# Patient Record
Sex: Female | Born: 1980 | Race: White | Hispanic: No | Marital: Single | Smoking: Current every day smoker
Health system: Southern US, Community
[De-identification: ages and names within clinical notes are randomized; demographics above are authoritative.]

## PROBLEM LIST (undated history)

## (undated) DIAGNOSIS — O26899 Other specified pregnancy related conditions, unspecified trimester: Secondary | ICD-10-CM

## (undated) DIAGNOSIS — Z789 Other specified health status: Secondary | ICD-10-CM

## (undated) DIAGNOSIS — F32A Depression, unspecified: Secondary | ICD-10-CM

## (undated) DIAGNOSIS — R12 Heartburn: Secondary | ICD-10-CM

## (undated) DIAGNOSIS — F329 Major depressive disorder, single episode, unspecified: Secondary | ICD-10-CM

## (undated) HISTORY — PX: TUBAL LIGATION: SHX77

---
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
## 2002-09-03 ENCOUNTER — Encounter: Payer: Self-pay | Admitting: *Deleted

## 2002-09-03 ENCOUNTER — Ambulatory Visit (HOSPITAL_COMMUNITY): Admission: RE | Admit: 2002-09-03 | Discharge: 2002-09-03 | Payer: Self-pay | Admitting: *Deleted

## 2002-09-23 ENCOUNTER — Encounter: Payer: Self-pay | Admitting: *Deleted

## 2002-09-23 ENCOUNTER — Ambulatory Visit (HOSPITAL_COMMUNITY): Admission: RE | Admit: 2002-09-23 | Discharge: 2002-09-23 | Payer: Self-pay | Admitting: *Deleted

## 2003-03-21 ENCOUNTER — Emergency Department (HOSPITAL_COMMUNITY): Admission: EM | Admit: 2003-03-21 | Discharge: 2003-03-21 | Payer: Self-pay | Admitting: Emergency Medicine

## 2004-04-03 ENCOUNTER — Ambulatory Visit: Payer: Self-pay | Admitting: Family Medicine

## 2004-04-03 ENCOUNTER — Other Ambulatory Visit: Admission: RE | Admit: 2004-04-03 | Discharge: 2004-04-03 | Payer: Self-pay | Admitting: Family Medicine

## 2004-06-11 ENCOUNTER — Ambulatory Visit: Payer: Self-pay | Admitting: Family Medicine

## 2004-08-13 ENCOUNTER — Ambulatory Visit: Payer: Self-pay | Admitting: Family Medicine

## 2004-11-02 ENCOUNTER — Ambulatory Visit: Payer: Self-pay | Admitting: Family Medicine

## 2005-01-24 ENCOUNTER — Ambulatory Visit: Payer: Self-pay | Admitting: Family Medicine

## 2005-01-25 ENCOUNTER — Encounter: Admission: RE | Admit: 2005-01-25 | Discharge: 2005-01-25 | Payer: Self-pay | Admitting: Family Medicine

## 2005-01-29 ENCOUNTER — Other Ambulatory Visit: Admission: RE | Admit: 2005-01-29 | Discharge: 2005-01-29 | Payer: Self-pay | Admitting: Family Medicine

## 2005-01-29 ENCOUNTER — Ambulatory Visit: Payer: Self-pay | Admitting: Family Medicine

## 2005-01-29 ENCOUNTER — Encounter (INDEPENDENT_AMBULATORY_CARE_PROVIDER_SITE_OTHER): Payer: Self-pay | Admitting: *Deleted

## 2005-10-15 ENCOUNTER — Ambulatory Visit: Payer: Self-pay | Admitting: Family Medicine

## 2005-10-22 ENCOUNTER — Encounter: Admission: RE | Admit: 2005-10-22 | Discharge: 2005-10-22 | Payer: Self-pay | Admitting: Family Medicine

## 2006-02-14 ENCOUNTER — Ambulatory Visit: Payer: Self-pay | Admitting: Family Medicine

## 2006-02-14 ENCOUNTER — Other Ambulatory Visit: Admission: RE | Admit: 2006-02-14 | Discharge: 2006-02-14 | Payer: Self-pay | Admitting: Family Medicine

## 2006-02-14 ENCOUNTER — Encounter: Payer: Self-pay | Admitting: Family Medicine

## 2006-05-29 ENCOUNTER — Ambulatory Visit: Payer: Self-pay | Admitting: Internal Medicine

## 2007-04-02 ENCOUNTER — Other Ambulatory Visit: Admission: RE | Admit: 2007-04-02 | Discharge: 2007-04-02 | Payer: Self-pay | Admitting: Family Medicine

## 2007-04-03 ENCOUNTER — Encounter: Admission: RE | Admit: 2007-04-03 | Discharge: 2007-04-03 | Payer: Self-pay | Admitting: Family Medicine

## 2007-04-06 ENCOUNTER — Encounter: Admission: RE | Admit: 2007-04-06 | Discharge: 2007-04-06 | Payer: Self-pay | Admitting: Family Medicine

## 2008-10-20 ENCOUNTER — Ambulatory Visit (HOSPITAL_COMMUNITY): Admission: RE | Admit: 2008-10-20 | Discharge: 2008-10-20 | Payer: Self-pay | Admitting: Family Medicine

## 2009-01-05 ENCOUNTER — Emergency Department (HOSPITAL_COMMUNITY): Admission: EM | Admit: 2009-01-05 | Discharge: 2009-01-05 | Payer: Self-pay | Admitting: Family Medicine

## 2010-02-25 NOTE — L&D Delivery Note (Signed)
Preoperative diagnosis: Intrauterine pregnancy at term with prior cesarean section for repeat. Multi-. Desirous of sterility.  Postoperative diagnosis: Same  Procedure: Repeat low transverse cesarean section. Bilateral tubal ligation.  Surgeon: Richardean Chimera  Anesthesia: Spinal  Estimated blood loss: 600 cc  Intraoperative blood replacement: None  Complications: None  Findings: Viable female infant Apgars 9 and 9

## 2010-03-28 LAB — HIV ANTIBODY (ROUTINE TESTING W REFLEX): HIV: NONREACTIVE

## 2010-03-28 LAB — ABO/RH: RH Type: POSITIVE

## 2010-03-28 LAB — HEPATITIS B SURFACE ANTIGEN: Hepatitis B Surface Ag: POSITIVE

## 2010-03-28 LAB — ANTIBODY SCREEN: Antibody Screen: NEGATIVE

## 2010-03-28 LAB — RUBELLA ANTIBODY, IGM: Rubella: IMMUNE

## 2010-07-13 NOTE — Op Note (Signed)
[facility]  Patient:    Laura English, Laura English                     MRN: [id number] Proc. Date: 05/03/00 Adm. Date:  [id number] Attending:  Douglass Rivers                           Operative Report  PREOPERATIVE DIAGNOSES:       1. Arrest of dilation and descent.                               2. O.T. arrest.                               3. Chorioamnionitis.  POSTOPERATIVE DIAGNOSES:      1. Arrest of dilation and descent.                               2. O.T. arrest.                               3. Chorioamnionitis.  PROCEDURE:                    Primary cesarean section, left lateral transverse.  SURGEON:                      Douglass Rivers, M.D.  ASSISTANT:                    Scrub technician.  ANESTHESIA:                   Epidural.  ESTIMATED BLOOD LOSS:         300 cc.  FINDINGS:                     Viable female infant in the OT presentation. Clear amniotic fluid.  Apgars 9 and 9.  Birth weight 7 lb 15 oz.  Normal uterus, tubes and ovaries.  COMPLICATIONS:                None.  PATHOLOGY:                    Placenta.  DESCRIPTION OF PROCEDURE:     The patient was taken to the operating room and placed in the supine position with left lateral displacement, prepped and draped in the usual sterile fashion.  After adequate anesthesia was ensured, a Pfannenstiel skin incision was made with a scalpel after the area was injected with 5 cc of 2% lidocaine solution.  The incision was carried through the underlying layer of fascia with electrocautery.  The fascia was scored in the midline.  The incision was extended lateral with Mayo scissors.  The inferior aspect of the fascial incision was grasped with Kochers.  The underlying muscles were dissected off in blunt and sharp fashion.  In a similar fashion, the superior aspect of the fascial incision was grasped with Kochers.  The underlying rectus muscles were separated in the midline.  The  peritoneum was identified and entered by blunt dissection.  The peritoneal incision was then extended superiorly and inferiorly with good visualization of the underlying  bowel and bladder.  The orientation of the uterus was confirmed.  A bladder blade was inserted.  The vesicouterine peritoneum was identified, tented up and entered sharply with Metzenbaums.  A bladder flap was created digitally. The bladder blade was then reinserted.  The lower uterine segment was incised in a transverse fashion with a scalpel.  The infant was delivered from the OT presentation.  The cord was clamped and cut.  The infant was handed off to the awaiting pediatricians.  Cord bloods were obtained.  The uterus was massaged and the placenta was allowed to separate naturally.  The uterus was cleared of all clots and debris.  The incision was then closed in a running locked layer of 0 chromic.  The pelvis was irrigated with copious amounts of warm saline.  The adnexa were inspected and noted to be hemostatic.  Inspection of the incision assured [place] of hemostasis.  The peritoneum and muscles were inspected and noted to be hemostatic.  The fascia was closed with 0 Vicryl in a running fashion.  The subcu was irrigated and the skin was closed with staples.  The patient tolerated the procedure well.  Sponge, lap, and needle counts were correct x 2.  She was given cefotetan intraoperatively and transferred to the PACU in stable condition. DD:  05/03/00 TD:  05/04/00 Job: [id number]

## 2010-07-13 NOTE — Discharge Summary (Signed)
[facility]  Patient:    Laura English, Laura English                     MRN: [id number] Adm. Date:  [id number] Disc. Date: [id number] Attending:  Douglass Rivers Dictator:   Antony Contras, N.P.                           Discharge Summary  DISCHARGE DIAGNOSES:          Intrauterine pregnancy at term, arrest of dilatation and descent, chorioamnionitis.  PROCEDURE:                    Low cervical transverse cesarean section with delivery of viable infant.  HISTORY OF PRESENT ILLNESS:   Patient is a 30 year old prima gravida with an LMP of August 07, 1999, [facility] May 12, 2000.  Prenatal course was uncomplicated other than the fact that this was a teen pregnancy.  PRENATAL LABORATORIES:        Blood type O+.  Antibody screen negative.  RPR, HBSAG, HIV nonreactive.  Rubella immune.  MSAFP normal.  HOSPITAL COURSE:              Patient was admitted on May 03, 2000 at 38 5/7 weeks with contractions and cervical dilatation of 6 cm.  Artificial rupture of membranes was performed.  Patient did progress to 8 cm and at that time developed a temperature of 102.6 with some mild tachycardia.  It was felt the fetus was in an LOT position and poorly applied.  Options were discussed with patient and low cervical transverse cesarean section was performed by Dr. Farrel Gobble with delivery of viable female infant, Apgars 9/9, weight 7 pounds 15 ounces.  Pathology revealed normal uterus, tubes, and ovaries. Postoperatively patients temperature maximum was 100.5.  LABORATORIES:                 CBC:  Hematocrit 30.5, hemoglobin 10.9, WBC 20.4, platelets 262,000.                                She did have no difficulty voiding or any other complications and was able to be discharged on her third postoperative day in satisfactory condition.  DISPOSITION:                  Follow-up in six weeks.  Continue prenatal vitamins and iron, Tylox for pain. DD:  05/26/00 TD:  05/26/00 Job:  [id number]

## 2010-08-16 DIAGNOSIS — O4700 False labor before 37 completed weeks of gestation, unspecified trimester: Principal | ICD-10-CM

## 2010-09-16 ENCOUNTER — Inpatient Hospital Stay (HOSPITAL_COMMUNITY)
Admission: AD | Admit: 2010-09-16 | Discharge: 2010-09-16 | Disposition: A | Discharge status: Home / Self Care | Payer: Medicaid Other | Source: Ambulatory Visit | Attending: Obstetrics and Gynecology | Admitting: Obstetrics and Gynecology

## 2010-09-16 ENCOUNTER — Encounter (HOSPITAL_COMMUNITY): Payer: Self-pay | Admitting: *Deleted

## 2010-09-16 DIAGNOSIS — O4700 False labor before 37 completed weeks of gestation, unspecified trimester: Principal | ICD-10-CM | POA: Insufficient documentation

## 2010-09-16 DIAGNOSIS — O99330 Smoking (tobacco) complicating pregnancy, unspecified trimester: Secondary | ICD-10-CM | POA: Insufficient documentation

## 2010-09-16 DIAGNOSIS — N898 Other specified noninflammatory disorders of vagina: Secondary | ICD-10-CM | POA: Insufficient documentation

## 2010-09-16 DIAGNOSIS — O99891 Other specified diseases and conditions complicating pregnancy: Secondary | ICD-10-CM | POA: Insufficient documentation

## 2010-09-16 HISTORY — DX: Other specified health status: Z78.9

## 2010-09-16 LAB — URINALYSIS, ROUTINE W REFLEX MICROSCOPIC
Leukocytes, UA: NEGATIVE
Nitrite: NEGATIVE
Specific Gravity, Urine: 1.01 (ref 1.005–1.030)
pH: 6 (ref 5.0–8.0)

## 2010-09-16 LAB — WET PREP, GENITAL
Clue Cells Wet Prep HPF POC: NONE SEEN
Trich, Wet Prep: NONE SEEN

## 2010-09-16 NOTE — Progress Notes (Signed)
Pt G3 P2, at 32.6wks, having abd pressure and cramping since early afternoon.  Pt hx C/S x 2.  Pt reports having an increase in clear watery discharge x 2-3wks, seen in the office and MD evaluated.

## 2010-09-16 NOTE — ED Provider Notes (Signed)
History     Chief Complaint  Patient presents with  . Abdominal Cramping   HPI 30 y.o.G3P2002 at [redacted]w[redacted]d c/o contractions since about 1 PM this afternoon, getting stronger. No vaginal bleeding. No LOF. + fetal movement. Uncomplicated prenatal course. H/o C/S x 2.   OB History    Grav Para Term Preterm Abortions TAB SAB Ect Mult Living   3 2 2       2       Past Medical History  Diagnosis Date  . No pertinent past medical history     Past Surgical History  Procedure Date  . Cesarean section     No family history on file.  History  Substance Use Topics  . Smoking status: Current Everyday Smoker -- 0.5 packs/day  . Smokeless tobacco: Not on file  . Alcohol Use: No    Allergies: No Known Allergies  No prescriptions prior to admission    Review of Systems  Constitutional: Negative.   Eyes: Negative.   Respiratory: Negative.   Cardiovascular: Negative.   Gastrointestinal: Negative.   Genitourinary:       + contractions, negative vaginal bleeding and LOF  Musculoskeletal: Negative.   Neurological: Negative.   Psychiatric/Behavioral: Negative.    Physical Exam   Blood pressure 109/72, pulse 73, temperature 98 F (36.7 C), temperature source Oral, resp. rate 18, height 5\' 2"  (1.575 m), weight 61.054 kg (134 lb 9.6 oz).  Physical Exam  Constitutional: She is oriented to person, place, and time. She appears well-developed and well-nourished. No distress.  Cardiovascular: Normal rate.   Respiratory: Effort normal.  GI: Soft. There is no tenderness.  Genitourinary: There is no rash or lesion on the right labia. There is no rash or lesion on the left labia. No tenderness or bleeding around the vagina. No signs of injury around the vagina. Vaginal discharge (thick white) found.       SVE: long/thick/closed  Musculoskeletal: Normal range of motion.  Neurological: She is alert and oriented to person, place, and time.  Skin: Skin is warm and dry.    MAU Course    Procedures  Results for orders placed during the hospital encounter of 09/16/10 (from the past 24 hour(s))  URINALYSIS, ROUTINE W REFLEX MICROSCOPIC     Status: Normal   Collection Time   09/16/10 10:15 PM      Component Value Range   Color, Urine YELLOW  YELLOW    Appearance CLEAR  CLEAR    Specific Gravity, Urine 1.010  1.005 - 1.030    pH 6.0  5.0 - 8.0    Glucose, UA NEGATIVE  NEGATIVE (mg/dL)   Hgb urine dipstick NEGATIVE  NEGATIVE    Bilirubin Urine NEGATIVE  NEGATIVE    Ketones, ur NEGATIVE  NEGATIVE (mg/dL)   Protein, ur NEGATIVE  NEGATIVE (mg/dL)   Urobilinogen, UA 0.2  0.0 - 1.0 (mg/dL)   Nitrite NEGATIVE  NEGATIVE    Leukocytes, UA NEGATIVE  NEGATIVE   WET PREP, GENITAL     Status: Abnormal   Collection Time   09/16/10 10:20 PM      Component Value Range   Yeast, Wet Prep NONE SEEN  NONE SEEN    Trich, Wet Prep NONE SEEN  NONE SEEN    Clue Cells, Wet Prep NONE SEEN  NONE SEEN    WBC, Wet Prep HPF POC MODERATE (*) NONE SEEN   FETAL FIBRONECTIN     Status: Normal   Collection Time   09/16/10 10:20  PM      Component Value Range   Fetal Fibronectin NEGATIVE  NEGATIVE    Consult with Dr. Marcelle Overlie, d/c home with no cervical change and negative FFN  Assessment and Plan  30 y.o.G3P2002 at [redacted]w[redacted]d with threatened PTL D/C home with precautions, f/u as scheduled or sooner PRN  FRAZIER,NATALIE 09/17/2010, 3:10 AM

## 2010-09-16 NOTE — Progress Notes (Signed)
Had not felt the baby in over an hr, but has felt the baby on the way here.  "light abd cramping" off and on since this afternoon (between 1-3p).  Not feeling right.  G3p2

## 2010-10-09 ENCOUNTER — Inpatient Hospital Stay (HOSPITAL_COMMUNITY)
Admission: AD | Admit: 2010-10-09 | Discharge: 2010-10-09 | Disposition: A | Discharge status: Home / Self Care | Payer: Medicaid Other | Source: Ambulatory Visit | Attending: Obstetrics and Gynecology | Admitting: Obstetrics and Gynecology

## 2010-10-09 ENCOUNTER — Encounter (HOSPITAL_COMMUNITY): Payer: Self-pay | Admitting: *Deleted

## 2010-10-09 DIAGNOSIS — O479 False labor, unspecified: Secondary | ICD-10-CM | POA: Insufficient documentation

## 2010-10-09 LAB — URINALYSIS, ROUTINE W REFLEX MICROSCOPIC
Bilirubin Urine: NEGATIVE
Hgb urine dipstick: NEGATIVE
Nitrite: NEGATIVE
Specific Gravity, Urine: <1.005 — ABNORMAL LOW (ref 1.005–1.030)
pH: 6.5 (ref 5.0–8.0)

## 2010-10-09 MED ORDER — ZOLPIDEM TARTRATE 10 MG PO TABS
10.0000 mg | ORAL_TABLET | Freq: Once | ORAL | Status: AC
  Administered 2010-10-09: 10 mg via ORAL
  Filled 2010-10-09: qty 1

## 2010-10-09 NOTE — Progress Notes (Signed)
Dr. Marcelle Overlie notified of pt UC, FHR and SVE.  Orders rec'd for D/C.

## 2010-10-09 NOTE — Progress Notes (Signed)
Pt states, " I've had sporadic irregular contractions all day, but at 8 pm that became 8-10 min apart, and are maybe a little closer now."

## 2010-10-09 NOTE — Progress Notes (Signed)
Pt reports having contractions since 1630, becoming more intense.  Pt G3 P2 previous C/S x 2.

## 2010-10-20 ENCOUNTER — Encounter (HOSPITAL_COMMUNITY): Payer: Self-pay | Admitting: Obstetrics and Gynecology

## 2010-10-20 ENCOUNTER — Inpatient Hospital Stay (HOSPITAL_COMMUNITY)
Admission: AD | Admit: 2010-10-20 | Discharge: 2010-10-21 | Disposition: A | Discharge status: Home / Self Care | Payer: Medicaid Other | Source: Ambulatory Visit | Attending: Obstetrics and Gynecology | Admitting: Obstetrics and Gynecology

## 2010-10-20 DIAGNOSIS — R070 Pain in throat: Secondary | ICD-10-CM | POA: Insufficient documentation

## 2010-10-20 DIAGNOSIS — O99891 Other specified diseases and conditions complicating pregnancy: Secondary | ICD-10-CM | POA: Insufficient documentation

## 2010-10-20 DIAGNOSIS — R509 Fever, unspecified: Secondary | ICD-10-CM | POA: Insufficient documentation

## 2010-10-20 DIAGNOSIS — J029 Acute pharyngitis, unspecified: Secondary | ICD-10-CM

## 2010-10-20 DIAGNOSIS — O99330 Smoking (tobacco) complicating pregnancy, unspecified trimester: Secondary | ICD-10-CM | POA: Insufficient documentation

## 2010-10-20 MED ORDER — ACETAMINOPHEN 325 MG PO TABS
650.0000 mg | ORAL_TABLET | ORAL | Status: DC | PRN
  Administered 2010-10-20: 650 mg via ORAL
  Filled 2010-10-20: qty 2

## 2010-10-20 NOTE — Progress Notes (Signed)
"  Everybody around me has been sick in the last week.  I just started feeling bad yesterday morning.  This evening I started having a fever of 100.5 for an hour after 1000 mg Tylenol.  The RN told me that was too high and to come in."

## 2010-10-20 NOTE — ED Provider Notes (Signed)
History     Chief Complaint  Patient presents with  . Fever  . Sore Throat   HPI  Presents with c/o sore throat since yesterday with onset of fever this evening. States temp was 100.5 for one hour even after taking Tylenol.  States has been around nephews who have had strep.  Denies other symptoms. Reports + fetal movement.  Past Medical History  Diagnosis Date  . No pertinent past medical history     Past Surgical History  Procedure Date  . Cesarean section     No family history on file.  History  Substance Use Topics  . Smoking status: Current Everyday Smoker -- 0.5 packs/day  . Smokeless tobacco: Not on file  . Alcohol Use: No    Allergies: No Known Allergies  Prescriptions prior to admission  Medication Sig Dispense Refill  . acetaminophen (TYLENOL) 500 MG tablet Take 1,000 mg by mouth every 6 (six) hours as needed. For pain and fever      . calcium carbonate (TUMS EX) 750 MG chewable tablet Chew 3 tablets by mouth daily.        . famotidine (PEPCID) 20 MG tablet Take 20 mg by mouth daily.        Marland Kitchen Phenylephrine-DM-GG-APAP (TYLENOL COLD MULTI-SYMPTOM) 5-10-200-325 MG TABS Take 2 tablets by mouth daily as needed. As needed for cold symptom and fever       . prenatal vitamin w/FE, FA (PRENATAL 1 + 1) 27-1 MG TABS Take 1 tablet by mouth daily.         Review of Systems  Constitutional: Positive for fever.  HENT: Positive for sore throat.    Physical Exam   Blood pressure 106/66, pulse 93, temperature 98.5 F (36.9 C), temperature source Oral, resp. rate 20, height 5\' 2"  (1.575 m), weight 138 lb (62.596 kg).  Physical Exam  Constitutional: She is oriented to person, place, and time. She appears well-developed and well-nourished.  HENT:  Mouth/Throat: Oropharynx is clear and moist. No oropharyngeal exudate.       Throat erethematous  Cardiovascular: Normal rate.   Respiratory: Effort normal.  GI: Soft.  Neurological: She is alert and oriented to person,  place, and time.  Skin: Skin is warm and dry.  Psychiatric: She has a normal mood and affect.    MAU Course  Procedures  MDM   Assessment and Plan  Dr Arelia Sneddon consulted Will send Rapid strep     [facility] 10/20/2010, 9:27 PM   Addendum:  Sample just picked up by courier. Discussed extra hour or so of wait time for result. Pt wishes to go on home. I suggest she call her doctor in the AM to find the result and determine if she needs antibiotics. She states the Tylenol helped a great deal.  Come back if worsens.

## 2010-10-20 NOTE — Progress Notes (Signed)
Pt states, " I woke yesterday am with a sore throat, and nasal congestion. Today at 5:30pm I started feeling really bad so I checked my temp and it was 100.5. I took tylenol 1000 mg but 60 min latter it was still the same."

## 2010-10-20 NOTE — Progress Notes (Signed)
Call placed to lab and CNM informed that rapid strep test specimen have just been picked up and will take at least an hour to result.

## 2010-10-21 LAB — RAPID STREP SCREEN (MED CTR MEBANE ONLY): Streptococcus, Group A Screen (Direct): NEGATIVE

## 2010-10-24 ENCOUNTER — Encounter (HOSPITAL_COMMUNITY)
Admission: RE | Admit: 2010-10-24 | Discharge: 2010-10-24 | Disposition: A | Discharge status: Home / Self Care | Payer: Medicaid Other | Source: Ambulatory Visit | Attending: Obstetrics and Gynecology | Admitting: Obstetrics and Gynecology

## 2010-10-24 ENCOUNTER — Encounter (HOSPITAL_COMMUNITY): Payer: Self-pay

## 2010-10-24 HISTORY — DX: Other specified pregnancy related conditions, unspecified trimester: O26.899

## 2010-10-24 HISTORY — DX: Heartburn: R12

## 2010-10-24 HISTORY — DX: Major depressive disorder, single episode, unspecified: F32.9

## 2010-10-24 HISTORY — DX: Depression, unspecified: F32.A

## 2010-10-24 LAB — RPR: RPR Ser Ql: NONREACTIVE

## 2010-10-24 LAB — SURGICAL PCR SCREEN
MRSA, PCR: POSITIVE — AB
Staphylococcus aureus: POSITIVE — AB

## 2010-10-24 LAB — CBC
HCT: 32.8 % — ABNORMAL LOW (ref 36.0–46.0)
RDW: 13 % (ref 11.5–15.5)
WBC: 10.7 10*3/uL — ABNORMAL HIGH (ref 4.0–10.5)

## 2010-10-24 NOTE — Patient Instructions (Signed)
20 Laura English  10/24/2010   Your procedure is scheduled on: 10/30/2010  Report to [facility] at 600 AM.  Call this number if you have problems the morning of surgery: [phone]   Remember:   Do not eat food:After Midnight.  Do not drink clear liquids: After Midnight.  Take these medicines the morning of surgery with A SIP OF WATER: pepcid   Do not wear jewelry, make-up or nail polish.  Do not wear lotions, powders, or perfumes. You may wear deodorant.  Do not shave 48 hours prior to surgery.  Do not bring valuables to the hospital.  Contacts, dentures or bridgework may not be worn into surgery.  Leave suitcase in the car. After surgery it may be brought to your room.  For patients admitted to the hospital, checkout time is 11:00 AM the day of discharge.   Patients discharged the day of surgery will not be allowed to drive home.  Name and phone number of your driver: Reuel Boom [phone]  Special Instructions: CHG Shower Use Special Wash: 1/2 bottle night before surgery and 1/2 bottle morning of surgery.   Please read over the following fact sheets that you were given: MRSA Information and Care and Recovery After Surgery

## 2010-10-24 NOTE — Anesthesia Preprocedure Evaluation (Addendum)
Anesthesia Evaluation  Name, MR# and DOB Patient awake  General Assessment Comment  Reviewed: Allergy & Precautions, H&P , Patient's Chart, lab work & pertinent test results  Airway Mallampati: II TM Distance: >3 FB Neck ROM: full    Dental No notable dental hx. (+) Poor Dentition   Pulmonary  clear to auscultation  pulmonary exam normalPulmonary Exam Normal breath sounds clear to auscultation none    Cardiovascular Exercise Tolerance: Good regular Normal    Neuro/Psych   GI/Hepatic/Renal   Endo/Other    Abdominal   Musculoskeletal   Hematology   Peds  Reproductive/Obstetrics    Anesthesia Other Findings Smoker           Anesthesia Physical Anesthesia Plan  ASA: II  Anesthesia Plan: Spinal   Post-op Pain Management:    Induction:   Airway Management Planned:   Additional Equipment:   Intra-op Plan:   Post-operative Plan:   Informed Consent: I have reviewed the patients History and Physical, chart, labs and discussed the procedure including the risks, benefits and alternatives for the proposed anesthesia with the patient or authorized representative who has indicated his/her understanding and acceptance.   Dental Advisory Given  Plan Discussed with: CRNA  Anesthesia Plan Comments: (Lab work confirmed with CRNA in room. Platelets okay. Discussed spinal anesthetic, and patient consents to the procedure:  included risk of possible headache,backache, failed block, allergic reaction, and nerve injury. This patient was asked if she had any questions or concerns before the procedure started. )        Anesthesia Quick Evaluation

## 2010-10-25 NOTE — H&P (Signed)
  Patient name  Laura English DICTATION# [id number] CSN# [id number]  Juluis Mire, MD 10/25/2010 4:25 PM

## 2010-10-26 NOTE — H&P (Signed)
NAME:  Laura English, Laura English                 ACCOUNT NO.:  MEDICAL RECORD NO.:  [id number]  LOCATION:                                 FACILITY:  PHYSICIAN:  Juluis Mire, M.D.        DATE OF BIRTH:  DATE OF ADMISSION: DATE OF DISCHARGE:                             HISTORY & PHYSICAL   Date of surgery is October 30, 2010.  The patient is a 30 year old grade 3, para 2, abortus 0 female estimated date of confinement November 05, 2010.  Her estimated gestational age of 20 plus weeks.  Presents for repeat cesarean section and bilateral tubal ligation.  The patient has had 2 prior cesarean sections.  We are going to proceed with repeat at 39+ weeks.  She also desires a permanent sterilization.  Alternative forms of birth control have been discussed. The potential failure rate of 1 in 200 is quoted.  Failures can be in the form of ectopic pregnancy requiring further surgical management. Also potential irreversibility is explained.  Her prenatal course otherwise been uncomplicated.  Her group B strep is negative.  Her 50- gram Glucola was also normal.  In terms of allergies, no known drug allergies.  Medications include prenatal vitamins.  PAST MEDICAL HISTORY:  Please see prenatal records.  FAMILY HISTORY AND SOCIAL HISTORY:  Please see prenatal records.  REVIEW OF SYSTEMS:  Noncontributory.  PHYSICAL EXAMINATION:  VITAL SIGNS:  Physical the patient is afebrile with stable vital signs. HEENT: The patient is normocephalic.  Pupils are equal, round, reactive to light and accommodation.  Extraocular movements were intact.  Sclerae and conjunctivae clear.  Oropharynx clear. NECK:  Without thyromegaly. BREASTS:  Not examined. LUNGS:  Clear. CARDIOVASCULAR SYSTEM:  Regular rhythm and rate.  No murmurs or gallops. ABDOMEN:  Gravid uterus consistent with dates.  Well-healed low transverse incision. PELVIC:  Deferred. EXTREMITIES:  Trace edema. NEUROLOGICAL:  Grossly within normal  limits.  Deep tendon reflexes 1+. No clonus.  IMPRESSION: 1. Intrauterine pregnancy at 39+ weeks for repeat cesarean section. 2. Multiparity, desires sterility.  PLAN:  The patient to undergo a low transverse cesarean section with bilateral tubal ligation.  The risks of surgery have been discussed including the risk of infection.  Risk of hemorrhage that could require transfusion with the risk of AIDS or hepatitis.  Excessive bleeding could require hysterectomy. There is a risk of injury to adjacent organs including bladder, bowel, ureters that could require further exploratory surgery.  Risk of deep venous thrombosis and pulmonary embolus.  The patient expressed understands indications and risks.     Juluis Mire, M.D.     JSM/MEDQ  D:  10/25/2010  T:  10/25/2010  Job:  [id number]

## 2010-10-30 ENCOUNTER — Other Ambulatory Visit: Payer: Self-pay | Admitting: Obstetrics and Gynecology

## 2010-10-30 ENCOUNTER — Inpatient Hospital Stay (HOSPITAL_COMMUNITY)
Admission: RE | Admit: 2010-10-30 | Discharge: 2010-11-01 | DRG: 766 | Disposition: A | Discharge status: Home / Self Care | Payer: Medicaid Other | Source: Ambulatory Visit | Attending: Obstetrics and Gynecology | Admitting: Obstetrics and Gynecology

## 2010-10-30 ENCOUNTER — Encounter (HOSPITAL_COMMUNITY): Payer: Self-pay | Admitting: *Deleted

## 2010-10-30 ENCOUNTER — Inpatient Hospital Stay (HOSPITAL_COMMUNITY): Payer: Medicaid Other | Admitting: Anesthesiology

## 2010-10-30 ENCOUNTER — Encounter (HOSPITAL_COMMUNITY)
Admission: RE | Disposition: A | Discharge status: Home / Self Care | Payer: Self-pay | Source: Ambulatory Visit | Attending: Obstetrics and Gynecology

## 2010-10-30 ENCOUNTER — Encounter (HOSPITAL_COMMUNITY): Payer: Self-pay | Admitting: Anesthesiology

## 2010-10-30 DIAGNOSIS — Z302 Encounter for sterilization: Secondary | ICD-10-CM

## 2010-10-30 DIAGNOSIS — Z98891 History of uterine scar from previous surgery: Secondary | ICD-10-CM

## 2010-10-30 DIAGNOSIS — O34219 Maternal care for unspecified type scar from previous cesarean delivery: Principal | ICD-10-CM | POA: Diagnosis present

## 2010-10-30 LAB — ABO/RH: ABO/RH(D): O POS

## 2010-10-30 SURGERY — Surgical Case
Anesthesia: Spinal | Site: Abdomen | Laterality: Bilateral | Wound class: Clean Contaminated

## 2010-10-30 MED ORDER — MORPHINE SULFATE (PF) 0.5 MG/ML IJ SOLN
INTRAMUSCULAR | Status: DC | PRN
  Administered 2010-10-30: .15 mg via EPIDURAL

## 2010-10-30 MED ORDER — MORPHINE SULFATE 0.5 MG/ML IJ SOLN
INTRAMUSCULAR | Status: AC
  Filled 2010-10-30: qty 10

## 2010-10-30 MED ORDER — SCOPOLAMINE 1 MG/3DAYS TD PT72: 1.0000 | MEDICATED_PATCH | Freq: Once | TRANSDERMAL | Status: DC

## 2010-10-30 MED ORDER — KETOROLAC TROMETHAMINE 60 MG/2ML IM SOLN
INTRAMUSCULAR | Status: AC
  Administered 2010-10-30: 60 mg via INTRAMUSCULAR
  Filled 2010-10-30: qty 2

## 2010-10-30 MED ORDER — ONDANSETRON HCL 4 MG/2ML IJ SOLN
INTRAMUSCULAR | Status: DC | PRN
  Administered 2010-10-30: 4 mg via INTRAVENOUS

## 2010-10-30 MED ORDER — MEPERIDINE HCL 25 MG/ML IJ SOLN: 6.2500 mg | INTRAMUSCULAR | Status: DC | PRN

## 2010-10-30 MED ORDER — EPHEDRINE 5 MG/ML INJ
INTRAVENOUS | Status: AC
  Filled 2010-10-30: qty 10

## 2010-10-30 MED ORDER — SODIUM CHLORIDE 0.9 % IV SOLN: 1.0000 ug/kg/h | INTRAVENOUS | Status: DC | PRN

## 2010-10-30 MED ORDER — OXYTOCIN 20 UNITS IN LACTATED RINGERS INFUSION - SIMPLE
INTRAVENOUS | Status: DC | PRN
  Administered 2010-10-30: 20 [IU] via INTRAVENOUS

## 2010-10-30 MED ORDER — FENTANYL CITRATE 0.05 MG/ML IJ SOLN
INTRAMUSCULAR | Status: DC | PRN
  Administered 2010-10-30: 20 ug via INTRAVENOUS

## 2010-10-30 MED ORDER — IBUPROFEN 600 MG PO TABS
600.0000 mg | ORAL_TABLET | Freq: Four times a day (QID) | ORAL | Status: DC
  Administered 2010-10-30 – 2010-11-01 (×7): 600 mg via ORAL
  Filled 2010-10-30 (×8): qty 1

## 2010-10-30 MED ORDER — SENNOSIDES-DOCUSATE SODIUM 8.6-50 MG PO TABS
2.0000 | ORAL_TABLET | Freq: Every day | ORAL | Status: DC
  Administered 2010-10-30 – 2010-10-31 (×2): 2 via ORAL

## 2010-10-30 MED ORDER — METOCLOPRAMIDE HCL 5 MG/ML IJ SOLN: 10.0000 mg | Freq: Three times a day (TID) | INTRAMUSCULAR | Status: DC | PRN

## 2010-10-30 MED ORDER — NALBUPHINE HCL 10 MG/ML IJ SOLN: 5.0000 mg | INTRAMUSCULAR | Status: DC | PRN

## 2010-10-30 MED ORDER — ONDANSETRON HCL 4 MG/2ML IJ SOLN
INTRAMUSCULAR | Status: AC
  Filled 2010-10-30: qty 2

## 2010-10-30 MED ORDER — ONDANSETRON HCL 4 MG/2ML IJ SOLN: 4.0000 mg | INTRAMUSCULAR | Status: DC | PRN

## 2010-10-30 MED ORDER — DIPHENHYDRAMINE HCL 50 MG/ML IJ SOLN: 25.0000 mg | INTRAMUSCULAR | Status: DC | PRN

## 2010-10-30 MED ORDER — NALOXONE HCL 0.4 MG/ML IJ SOLN: 0.4000 mg | INTRAMUSCULAR | Status: DC | PRN

## 2010-10-30 MED ORDER — OXYTOCIN 20 UNITS IN LACTATED RINGERS INFUSION - SIMPLE
INTRAVENOUS | Status: AC
  Administered 2010-10-30: 20 [IU] via INTRAMUSCULAR
  Filled 2010-10-30: qty 1000

## 2010-10-30 MED ORDER — TETANUS-DIPHTH-ACELL PERTUSSIS 5-2.5-18.5 LF-MCG/0.5 IM SUSP: 0.5000 mL | Freq: Once | INTRAMUSCULAR | Status: DC

## 2010-10-30 MED ORDER — LACTATED RINGERS IV SOLN
INTRAVENOUS | Status: DC
  Administered 2010-10-30: 07:00:00 via INTRAVENOUS

## 2010-10-30 MED ORDER — OXYTOCIN 20 UNITS IN LACTATED RINGERS INFUSION - SIMPLE: 125.0000 mL/h | INTRAVENOUS | Status: AC

## 2010-10-30 MED ORDER — SODIUM CHLORIDE 0.9 % IJ SOLN: 3.0000 mL | Freq: Two times a day (BID) | INTRAMUSCULAR | Status: DC

## 2010-10-30 MED ORDER — LACTATED RINGERS IV SOLN
INTRAVENOUS | Status: DC | PRN
  Administered 2010-10-30 (×3): via INTRAVENOUS

## 2010-10-30 MED ORDER — SODIUM CHLORIDE 0.9 % IV SOLN: 250.0000 mL | INTRAVENOUS | Status: DC

## 2010-10-30 MED ORDER — CEFAZOLIN SODIUM 1-5 GM-% IV SOLN
INTRAVENOUS | Status: AC
  Administered 2010-10-30: 1 g via INTRAVENOUS
  Filled 2010-10-30: qty 50

## 2010-10-30 MED ORDER — SIMETHICONE 80 MG PO CHEW
80.0000 mg | CHEWABLE_TABLET | Freq: Three times a day (TID) | ORAL | Status: DC
  Administered 2010-10-30 – 2010-10-31 (×7): 80 mg via ORAL

## 2010-10-30 MED ORDER — WITCH HAZEL-GLYCERIN EX PADS: 1.0000 "application " | MEDICATED_PAD | CUTANEOUS | Status: DC | PRN

## 2010-10-30 MED ORDER — KETOROLAC TROMETHAMINE 30 MG/ML IJ SOLN: 30.0000 mg | Freq: Four times a day (QID) | INTRAMUSCULAR | Status: AC | PRN

## 2010-10-30 MED ORDER — ONDANSETRON HCL 4 MG/2ML IJ SOLN: 4.0000 mg | Freq: Three times a day (TID) | INTRAMUSCULAR | Status: DC | PRN

## 2010-10-30 MED ORDER — DIBUCAINE 1 % RE OINT: 1.0000 "application " | TOPICAL_OINTMENT | RECTAL | Status: DC | PRN

## 2010-10-30 MED ORDER — FENTANYL CITRATE 0.05 MG/ML IJ SOLN
INTRAMUSCULAR | Status: AC
  Filled 2010-10-30: qty 2

## 2010-10-30 MED ORDER — SCOPOLAMINE 1 MG/3DAYS TD PT72
1.0000 | MEDICATED_PATCH | TRANSDERMAL | Status: DC
  Administered 2010-10-30: 1.5 mg via TRANSDERMAL

## 2010-10-30 MED ORDER — MENTHOL 3 MG MT LOZG: 1.0000 | LOZENGE | OROMUCOSAL | Status: DC | PRN

## 2010-10-30 MED ORDER — DIPHENHYDRAMINE HCL 25 MG PO CAPS: 25.0000 mg | ORAL_CAPSULE | Freq: Four times a day (QID) | ORAL | Status: DC | PRN

## 2010-10-30 MED ORDER — ZOLPIDEM TARTRATE 5 MG PO TABS: 5.0000 mg | ORAL_TABLET | Freq: Every evening | ORAL | Status: DC | PRN

## 2010-10-30 MED ORDER — SODIUM CHLORIDE 0.9 % IJ SOLN: 3.0000 mL | INTRAMUSCULAR | Status: DC | PRN

## 2010-10-30 MED ORDER — FLEET ENEMA 7-19 GM/118ML RE ENEM: 1.0000 | ENEMA | RECTAL | Status: DC | PRN

## 2010-10-30 MED ORDER — DIPHENHYDRAMINE HCL 25 MG PO CAPS: 25.0000 mg | ORAL_CAPSULE | ORAL | Status: DC | PRN

## 2010-10-30 MED ORDER — SIMETHICONE 80 MG PO CHEW: 80.0000 mg | CHEWABLE_TABLET | ORAL | Status: DC | PRN

## 2010-10-30 MED ORDER — KETOROLAC TROMETHAMINE 60 MG/2ML IM SOLN
60.0000 mg | Freq: Once | INTRAMUSCULAR | Status: AC | PRN
  Filled 2010-10-30: qty 2

## 2010-10-30 MED ORDER — OXYCODONE-ACETAMINOPHEN 5-325 MG PO TABS
1.0000 | ORAL_TABLET | ORAL | Status: DC | PRN
  Administered 2010-10-30 – 2010-10-31 (×2): 1 via ORAL
  Filled 2010-10-30 (×4): qty 2
  Filled 2010-10-30: qty 1

## 2010-10-30 MED ORDER — OXYTOCIN 10 UNIT/ML IJ SOLN
INTRAMUSCULAR | Status: AC
  Filled 2010-10-30: qty 2

## 2010-10-30 MED ORDER — LANOLIN HYDROUS EX OINT: 1.0000 "application " | TOPICAL_OINTMENT | CUTANEOUS | Status: DC | PRN

## 2010-10-30 MED ORDER — BISACODYL 10 MG RE SUPP: 10.0000 mg | Freq: Every day | RECTAL | Status: DC | PRN

## 2010-10-30 MED ORDER — PRENATAL PLUS 27-1 MG PO TABS
1.0000 | ORAL_TABLET | Freq: Every day | ORAL | Status: DC
  Administered 2010-10-31 – 2010-11-01 (×2): 1 via ORAL
  Filled 2010-10-30 (×2): qty 1

## 2010-10-30 MED ORDER — DIPHENHYDRAMINE HCL 50 MG/ML IJ SOLN: 12.5000 mg | INTRAMUSCULAR | Status: DC | PRN

## 2010-10-30 MED ORDER — ONDANSETRON HCL 4 MG PO TABS: 4.0000 mg | ORAL_TABLET | ORAL | Status: DC | PRN

## 2010-10-30 MED ORDER — IBUPROFEN 600 MG PO TABS: 600.0000 mg | ORAL_TABLET | Freq: Four times a day (QID) | ORAL | Status: DC | PRN

## 2010-10-30 MED ORDER — LACTATED RINGERS IV SOLN
INTRAVENOUS | Status: DC
  Administered 2010-10-30: 18:00:00 via INTRAVENOUS

## 2010-10-30 MED ORDER — CEFAZOLIN SODIUM 1-5 GM-% IV SOLN: 1.0000 g | INTRAVENOUS | Status: DC

## 2010-10-30 MED ORDER — BUPIVACAINE IN DEXTROSE 0.75-8.25 % IT SOLN
INTRATHECAL | Status: DC | PRN
  Administered 2010-10-30: 1.5 mL via INTRATHECAL

## 2010-10-30 MED ORDER — EPHEDRINE SULFATE 50 MG/ML IJ SOLN
INTRAMUSCULAR | Status: DC | PRN
  Administered 2010-10-30 (×8): 10 mg via INTRAVENOUS

## 2010-10-30 MED ORDER — SCOPOLAMINE 1 MG/3DAYS TD PT72
MEDICATED_PATCH | TRANSDERMAL | Status: AC
  Administered 2010-10-30: 1.5 mg via TRANSDERMAL
  Filled 2010-10-30: qty 1

## 2010-10-30 SURGICAL SUPPLY — 29 items
CLOTH BEACON ORANGE TIMEOUT ST (SAFETY) ×2 IMPLANT
DRESSING TELFA 8X3 (GAUZE/BANDAGES/DRESSINGS) ×2 IMPLANT
ELECT REM PT RETURN 9FT ADLT (ELECTROSURGICAL) ×2
ELECTRODE REM PT RTRN 9FT ADLT (ELECTROSURGICAL) ×1 IMPLANT
EXTRACTOR VACUUM M CUP 4 TUBE (SUCTIONS) IMPLANT
GAUZE SPONGE 4X4 12PLY STRL LF (GAUZE/BANDAGES/DRESSINGS) ×4 IMPLANT
GLOVE BIO SURGEON STRL SZ7 (GLOVE) ×4 IMPLANT
GOWN PREVENTION PLUS LG XLONG (DISPOSABLE) ×4 IMPLANT
GOWN STRL REIN XL XLG (GOWN DISPOSABLE) ×2 IMPLANT
KIT ABG SYR 3ML LUER SLIP (SYRINGE) ×2 IMPLANT
NEEDLE HYPO 25X5/8 SAFETYGLIDE (NEEDLE) ×2 IMPLANT
NS IRRIG 1000ML POUR BTL (IV SOLUTION) ×2 IMPLANT
PACK C SECTION WH (CUSTOM PROCEDURE TRAY) ×2 IMPLANT
PAD ABD 7.5X8 STRL (GAUZE/BANDAGES/DRESSINGS) ×2 IMPLANT
SLEEVE SCD COMPRESS KNEE MED (MISCELLANEOUS) IMPLANT
STAPLER VISISTAT 35W (STAPLE) ×2 IMPLANT
STRIP CLOSURE SKIN 1/4X4 (GAUZE/BANDAGES/DRESSINGS) IMPLANT
SUT CHROMIC 0 CT 1 (SUTURE) IMPLANT
SUT CHROMIC 0 CTX 36 (SUTURE) ×4 IMPLANT
SUT CHROMIC 2 0 SH (SUTURE) IMPLANT
SUT PDS AB 0 CT 36 (SUTURE) ×2 IMPLANT
SUT PLAIN 0 NONE (SUTURE) ×2 IMPLANT
SUT PLAIN 2 0 XLH (SUTURE) IMPLANT
SUT VIC AB 3-0 CT1 27 (SUTURE) ×2
SUT VIC AB 3-0 CT1 TAPERPNT 27 (SUTURE) ×1 IMPLANT
TAPE CLOTH SURG 4X10 WHT LF (GAUZE/BANDAGES/DRESSINGS) ×2 IMPLANT
TOWEL OR 17X24 6PK STRL BLUE (TOWEL DISPOSABLE) ×4 IMPLANT
TRAY FOLEY CATH 14FR (SET/KITS/TRAYS/PACK) ×2 IMPLANT
WATER STERILE IRR 1000ML POUR (IV SOLUTION) IMPLANT

## 2010-10-30 NOTE — Op Note (Signed)
[name], English          ACCOUNT NO.:  [id number]  MEDICAL RECORD NO.:  [id number]  LOCATION:  [facility]                          FACILITY:  [facility]  PHYSICIAN:  Juluis Mire, M.D.   DATE OF BIRTH:  [birth year]  DATE OF PROCEDURE:  10/30/2010 DATE OF DISCHARGE:                              OPERATIVE REPORT   PREOPERATIVE DIAGNOSES: 1. Intrauterine pregnancy at term with prior cesarean section for     repeat. 2. Multiparity, desires sterility.  POSTOPERATIVE DIAGNOSES: 1. Intrauterine pregnancy at term with prior cesarean section for     repeat. 2. Multiparity, desires sterility.  OPERATIVE PROCEDURES:  Low transverse cesarean section with bilateral tubal ligation.  SURGEON:  Juluis Mire, MD  ANESTHESIA:  Spinal.  ESTIMATED BLOOD LOSS:  600 mL.  PACKS AND DRAINS:  None.  INTRAOPERATIVE BLOOD REPLACEMENT:  None.  COMPLICATIONS:  None.  INDICATIONS:  Dictated in history and physical.  DESCRIPTION OF PROCEDURE:  The patient was taken to the OR, placed in supine position with left lateral tilt.  After satisfactory level of spinal anesthesia was obtained, the abdomen was prepped out with Betadine and draped in sterile field.  A prior low transverse skin incision was made with a knife.  The prior incision was excised. Incision was extended through subcutaneous tissue.  Fascia was entered sharply and incision in the fascia extended laterally.  Fascia was taken off the muscle superiorly and inferiorly.  Rectus muscles were separated in the midline.  Anterior peritoneum was entered sharply and incision in the perineum extended both superiorly and inferiorly.  A low transverse bladder flap was developed.  A low transverse uterine incision was begun with a knife and extended laterally using manual traction.  The infant presented in the vertex presentation, was delivered with elevation of head and fundal pressure.  The infant was a viable female with Apgars of 9/9, weight  and pH are pending.  Placenta was delivered manually. Uterus was exteriorized for closure.  Uterus was closed with a running locking suture of 0 chromic using a two-layer closure technique.  We identified both fallopian tubes.  They were elevated with a Babcock tenaculum.  A hole was made in the avascular area of the mesosalpinx. Individual ligatures of 0 plain catgut were used to ligate off the segment of tube.  The intervening segment tube was excised.  Cut ends of the tubes were cauterized.  We had good hemostasis.  Tubes and ovaries were unremarkable.  Uterus was returned to the abdominal cavity.  We irrigated the pelvis and had good hemostasis.  At this point in time, urine output was clear.  Muscles and peritoneum were closed with running suture of 3-0 Vicryl.  Fascia was closed with running suture of 0 PDS. Skin was closed with staples and Steri-Strips.  Sponge, instrument, and needle count was reported as correct by circulating nurse x2.  Foley catheter remained clear at the time of closure.  The patient tolerated the procedure well and returned to recovery room in good condition.     Juluis Mire, M.D.     JSM/MEDQ  D:  10/30/2010  T:  10/30/2010  Job:  [id number]

## 2010-10-30 NOTE — Addendum Note (Signed)
Addendum  created 10/30/10 0949 by Oluwasemilore Pascuzzi   Modules edited:Anesthesia Medication Administration    

## 2010-10-30 NOTE — Addendum Note (Signed)
Addendum  created 10/30/10 0949 by Madison Hickman   Modules edited:Anesthesia Medication Administration

## 2010-10-30 NOTE — Transfer of Care (Signed)
Immediate Anesthesia Transfer of Care Note  Patient: Laura English  Procedure(s) Performed:  CESAREAN SECTION WITH BILATERAL TUBAL LIGATION  Patient Location: PACU  Anesthesia Type: Spinal  Level of Consciousness: awake, alert  and oriented  Airway & Oxygen Therapy: Patient Spontanous Breathing  Post-op Assessment: Report given to PACU RN and Post -op Vital signs reviewed and stable  Post vital signs: Reviewed and stable  Complications: No apparent anesthesia complications

## 2010-10-30 NOTE — Anesthesia Procedure Notes (Signed)
Spinal Block  Patient location during procedure: OR Staffing Anesthesiologist: Aleli Navedo EDWARD Preanesthetic Checklist Completed: patient identified, site marked, surgical consent, pre-op evaluation, timeout performed, IV checked, risks and benefits discussed and monitors and equipment checked Spinal Block Patient position: sitting Prep: DuraPrep Patient monitoring: heart rate, cardiac monitor, continuous pulse ox and blood pressure Approach: midline Location: L3-4 Injection technique: single-shot Needle Needle type: Sprotte  Needle gauge: 24 G Needle length: 9 cm Assessment Sensory level: T4 Additional Notes Spinal Dosage in OR  Bupivicaine ml       1.5 PFMS04   mcg        150 Fentanyl mcg            20    

## 2010-10-30 NOTE — Anesthesia Postprocedure Evaluation (Signed)
Anesthesia Post Note  Patient: Laura English  Procedure(s) Performed:  CESAREAN SECTION WITH BILATERAL TUBAL LIGATION  Anesthesia type: Spinal  Patient location: PACU  Post pain: Pain level controlled  Post assessment: Post-op Vital signs reviewed  Last Vitals:  Filed Vitals:   10/30/10 0900  BP: 124/69  Pulse: 61  Temp:   Resp: 16    Post vital signs: Reviewed  Level of consciousness: awake  Complications: No apparent anesthesia complications

## 2010-10-30 NOTE — Progress Notes (Signed)
  Patient Laura English [id number] CSN# [id number]  Juluis Mire, MD 10/30/2010 8:19 AM

## 2010-10-30 NOTE — Progress Notes (Signed)
UR chart review completed.  

## 2010-10-30 NOTE — Consult Note (Signed)
Neonatology Note:   Attendance at C-section:    I was asked to attend this repeat C/S at term. The mother is a G3P2 O pos, Hep B positive smoker.  ROM at delivery, fluid clear. Infant vigorous with good spontaneous cry and tone. Needed only minimal bulb suctioning. Ap 9/9. Lungs clear to ausc in DR. To CN to care of Pediatrician. RN aware of maternal Hep B status, advised to give no injections until infant is bathed.   Deatra James, MD

## 2010-10-30 NOTE — H&P (Signed)
  Physical unchanged 

## 2010-10-31 LAB — CBC
MCV: 96.8 fL (ref 78.0–100.0)
Platelets: 279 10*3/uL (ref 150–400)
RBC: 2.78 MIL/uL — ABNORMAL LOW (ref 3.87–5.11)
RDW: 13 % (ref 11.5–15.5)
WBC: 14.3 10*3/uL — ABNORMAL HIGH (ref 4.0–10.5)

## 2010-10-31 NOTE — Progress Notes (Signed)
Subjective: Postpartum Day 1: Cesarean Delivery Patient reports tolerating PO and no problems voiding.    Objective: Vital signs in last 24 hours: Temp:  [97.3 F (36.3 C)-98.3 F (36.8 C)] 98.3 F (36.8 C) (09/05 0400) Pulse Rate:  [57-86] 69  (09/05 0400) Resp:  [16-27] 16  (09/05 0400) BP: (95-130)/(60-79) 99/62 mmHg (09/05 0400) SpO2:  [95 %-100 %] 95 % (09/05 0400) Weight:  [62.596 kg (138 lb)] 138 lb (62.596 kg) (09/04 0957)  Physical Exam:  General: alert and cooperative Lochia: appropriate Uterine Fundus: firm Abd dressing CDI Decreased bs Foley discontinued voiding well DVT Evaluation: No evidence of DVT seen on physical exam.   Basename 10/31/10 0545  HGB 9.3*  HCT 26.9*    Assessment/Plan: Status post Cesarean section. Doing well postoperatively.  Continue current care.  Joe Gee G 10/31/2010, 9:04 AM

## 2010-11-01 MED ORDER — IBUPROFEN 600 MG PO TABS: 600.0000 mg | ORAL_TABLET | Freq: Four times a day (QID) | ORAL | Status: AC

## 2010-11-01 MED ORDER — OXYCODONE-ACETAMINOPHEN 5-325 MG PO TABS: 1.0000 | ORAL_TABLET | ORAL | Status: AC | PRN

## 2010-11-01 NOTE — Discharge Summary (Signed)
Admission Diagnosis: Iup at Term  Previous C Section x 2  Discharge Diagnosis: Same.  Patient is 30 year old admitted for Repeat Cesarean Section. She did very well postop. By Post op Day 2 she was ambulating, voiding, tolerating pain medication. She remained afebrile with stable vital signs.  Discharged home on POD #2 in excellent condition. Discharged home with Percocet. Followup in 1 week to remove staples

## 2010-11-01 NOTE — Progress Notes (Signed)
Subjective: Postpartum Day 2 : Cesarean Delivery Patient reports no problems voiding.   She wants to go home this morning.  Objective: Vital signs in last 24 hours: Temp:  [98.3 F (36.8 C)-98.7 F (37.1 C)] 98.4 F (36.9 C) (09/06 0555) Pulse Rate:  [73-84] 84  (09/06 0555) Resp:  [18-20] 20  (09/06 0555) BP: (99-110)/(60-74) 99/64 mmHg (09/06 0555) SpO2:  [98 %] 98 % (09/05 0930)  Physical Exam:  General: alert, cooperative and appears stated age Lochia: appropriate Uterine Fundus: firm Incision: healing well DVT Evaluation: No evidence of DVT seen on physical exam.   Basename 10/31/10 0545  HGB 9.3*  HCT 26.9*    Assessment/Plan: Status post Cesarean section. Doing well postoperatively.  Discharge home with standard precautions and return to clinic in 4-6 weeks.  Lovell Roe L 11/01/2010, 7:51 AM

## 2010-11-01 NOTE — Plan of Care (Signed)
Problem: Discharge Progression Outcomes Goal: Remove staples per MD order Outcome: Progressing Doctor will remove 7 days post op in the office

## 2010-11-13 ENCOUNTER — Encounter (HOSPITAL_COMMUNITY): Payer: Self-pay | Admitting: *Deleted

## 2012-12-07 ENCOUNTER — Ambulatory Visit (INDEPENDENT_AMBULATORY_CARE_PROVIDER_SITE_OTHER): Payer: BC Managed Care – PPO | Admitting: Family Medicine

## 2012-12-07 ENCOUNTER — Encounter: Payer: Self-pay | Admitting: Family Medicine

## 2012-12-07 VITALS — BP 100/60 | HR 58 | Temp 98.2°F | Resp 18 | Ht 61.0 in | Wt 117.0 lb

## 2012-12-07 DIAGNOSIS — Z124 Encounter for screening for malignant neoplasm of cervix: Secondary | ICD-10-CM

## 2012-12-07 DIAGNOSIS — Z0000 Encounter for general adult medical examination without abnormal findings: Secondary | ICD-10-CM

## 2012-12-07 DIAGNOSIS — Z113 Encounter for screening for infections with a predominantly sexual mode of transmission: Secondary | ICD-10-CM

## 2012-12-07 DIAGNOSIS — M25559 Pain in unspecified hip: Secondary | ICD-10-CM

## 2012-12-07 DIAGNOSIS — Z23 Encounter for immunization: Secondary | ICD-10-CM

## 2012-12-07 DIAGNOSIS — F411 Generalized anxiety disorder: Secondary | ICD-10-CM

## 2012-12-07 LAB — WET PREP FOR TRICH, YEAST, CLUE: Yeast Wet Prep HPF POC: NONE SEEN

## 2012-12-07 LAB — CBC WITH DIFFERENTIAL/PLATELET
Basophils Absolute: 0 10*3/uL (ref 0.0–0.1)
Eosinophils Relative: 1 % (ref 0–5)
HCT: 41.9 % (ref 36.0–46.0)
Hemoglobin: 14.2 g/dL (ref 12.0–15.0)
Lymphocytes Relative: 35 % (ref 12–46)
Lymphs Abs: 3.5 10*3/uL (ref 0.7–4.0)
MCV: 96.8 fL (ref 78.0–100.0)
Monocytes Absolute: 0.9 10*3/uL (ref 0.1–1.0)
Neutro Abs: 5.5 10*3/uL (ref 1.7–7.7)
RBC: 4.33 MIL/uL (ref 3.87–5.11)
RDW: 12.7 % (ref 11.5–15.5)
WBC: 10 10*3/uL (ref 4.0–10.5)

## 2012-12-07 LAB — COMPREHENSIVE METABOLIC PANEL
ALT: 8 U/L (ref 0–35)
AST: 13 U/L (ref 0–37)
Albumin: 3.8 g/dL (ref 3.5–5.2)
BUN: 8 mg/dL (ref 6–23)
CO2: 27 mEq/L (ref 19–32)
Calcium: 9.5 mg/dL (ref 8.4–10.5)
Chloride: 106 mEq/L (ref 96–112)
Potassium: 4.5 mEq/L (ref 3.5–5.3)

## 2012-12-07 LAB — LIPID PANEL: Cholesterol: 142 mg/dL (ref 0–200)

## 2012-12-07 MED ORDER — ESCITALOPRAM OXALATE 10 MG PO TABS: 10.0000 mg | ORAL_TABLET | Freq: Every day | ORAL | Status: DC

## 2012-12-07 NOTE — Patient Instructions (Signed)
Start the lexapro once a day We will call with lab results and PAP Smear results Flu shot given F/U 4 weeks for medications

## 2012-12-07 NOTE — Progress Notes (Signed)
  Subjective:    Patient ID: Laura English, female    DOB: 12-25-1980, 32 y.o.   MRN: [id number]  HPI Patient here to establish care. No previous PCP in greater than 5 years. She has 3 children and was followed by GYN and was treated for postpartum depression with Zoloft and Lexapro at one point. She's not had a Pap smear in 2 years. She also requests STD check she did have gonorrhea and chlamydia by her first husband when she was about 65 years old.  She's concerned today about anxiety and panic attacks. She is history depression anxiety per above was treated for postpartum depression. She also worked at a very stressful job for about a year was treated with Lexapro she actually had to be taken out of work for 6 weeks secondary to the severity of her symptoms. She did well on Lexapro however when she quit the job lost her insurance she went off of the medication. Over the past 3 months she has had increasing anxiety and stress. She is the only one working in her current family and she has 2 children at home with her. Her oldest daughter lives in [place] with her father. Her current husband lost his job about 7 months ago. They have a lot of difficulty with finances. She currently works full-time at [employer] and is also working overtime but it is still not helping ends meet. For the past couple months she has noticed panic attacks and off for sleep where she wakes up with palpitations tunnel vision and hyperventilating this has been increasing. She denies any hallucinations or suicidal ideation. She wants to restart medication for her anxiety.  Hip pain- history of right hip pain for the past 2-3 years. She states it feels very stiff when she is seated in [place] in position on the floor with her children she tries to get up. She denies any dislocation of the hip or any injury. There is no change in her pain when she was pregnant with her children. She denies any falls.   Review of Systems  GEN- denies fatigue, fever, weight loss,weakness, recent illness HEENT- denies eye drainage, change in vision, nasal discharge, CVS- denies chest pain, palpitations RESP- denies SOB, cough, wheeze ABD- denies N/V, change in stools, abd pain GU- denies dysuria, hematuria, dribbling, incontinence MSK- + joint pain, muscle aches, injury Neuro- denies headache, dizziness, syncope, seizure activity      Objective:   Physical Exam GEN- NAD, alert and oriented x3 HEENT- PERRL, EOMI, non injected sclera, pink conjunctiva, MMM, oropharynx clear Breast- normal symmetry, no nipple inversion,no nipple drainage, no nodules or lumps felt, clogged ducts bilat breast (chronic) Nodes- no axillary nodes Neck- Supple, no thyromegaly CVS- RRR, no murmur RESP-CTAB ABD-NABS,soft,NT,ND GU- normal external genitalia, vaginal mucosa pink and moist, cervix visualized no growth, no blood form os, minimal thin clear discharge, no CMT, no ovarian masses, uterus normal size MSK- Back NT, Spine NT, HIP- FROM, mild discomfort with internal rotation, strength equal bilat LE, neg SLR, normal gait EXT- No edema Pulses- Radial, DP- 2+ Psych- Not overly anxious, or depressed, crying during          Assessment & Plan:

## 2012-12-07 NOTE — Assessment & Plan Note (Signed)
Will start her on Lexapro 10 mg and titrate from there. Discussed medication she has done well on this before

## 2012-12-07 NOTE — Assessment & Plan Note (Signed)
Think this is more musculoskeletal pain may be so weakness in her hip muscles. She'll work on strengthening no imaging needed at this time

## 2012-12-07 NOTE — Assessment & Plan Note (Signed)
Pap smear done with physical exam. Fasting labs done. Flu shot given. Her tetanus booster is up to date

## 2012-12-08 LAB — PAP THINPREP ASCUS RFLX HPV RFLX TYPE

## 2013-01-04 ENCOUNTER — Ambulatory Visit: Payer: BC Managed Care – PPO | Admitting: Family Medicine

## 2013-01-04 ENCOUNTER — Ambulatory Visit (INDEPENDENT_AMBULATORY_CARE_PROVIDER_SITE_OTHER): Payer: BC Managed Care – PPO | Admitting: Family Medicine

## 2013-01-04 ENCOUNTER — Encounter: Payer: Self-pay | Admitting: Family Medicine

## 2013-01-04 VITALS — BP 98/68 | HR 82 | Temp 98.2°F | Resp 18 | Ht 61.0 in | Wt 116.0 lb

## 2013-01-04 DIAGNOSIS — G4700 Insomnia, unspecified: Secondary | ICD-10-CM | POA: Insufficient documentation

## 2013-01-04 DIAGNOSIS — F411 Generalized anxiety disorder: Secondary | ICD-10-CM

## 2013-01-04 DIAGNOSIS — N390 Urinary tract infection, site not specified: Secondary | ICD-10-CM | POA: Insufficient documentation

## 2013-01-04 LAB — URINALYSIS, ROUTINE W REFLEX MICROSCOPIC
Bilirubin Urine: NEGATIVE
Glucose, UA: NEGATIVE mg/dL
pH: 6 (ref 5.0–8.0)

## 2013-01-04 LAB — URINALYSIS, MICROSCOPIC ONLY
Casts: NONE SEEN
Crystals: NONE SEEN

## 2013-01-04 MED ORDER — CEPHALEXIN 500 MG PO CAPS: 500.0000 mg | ORAL_CAPSULE | Freq: Four times a day (QID) | ORAL | Status: DC

## 2013-01-04 MED ORDER — PHENAZOPYRIDINE HCL 100 MG PO TABS: 100.0000 mg | ORAL_TABLET | Freq: Three times a day (TID) | ORAL | Status: DC | PRN

## 2013-01-04 MED ORDER — ESCITALOPRAM OXALATE 20 MG PO TABS: 20.0000 mg | ORAL_TABLET | Freq: Every day | ORAL | Status: DC

## 2013-01-04 MED ORDER — LORAZEPAM 0.5 MG PO TABS: 0.5000 mg | ORAL_TABLET | Freq: Every evening | ORAL | Status: DC | PRN

## 2013-01-04 MED ORDER — FLUCONAZOLE 150 MG PO TABS: 150.0000 mg | ORAL_TABLET | Freq: Once | ORAL | Status: DC

## 2013-01-04 NOTE — Assessment & Plan Note (Signed)
Ativan prn insomnia

## 2013-01-04 NOTE — Assessment & Plan Note (Signed)
Symptoms improved, but still symptomatic, increase to lexapro 20mg  Ativan prn sleep

## 2013-01-04 NOTE — Patient Instructions (Signed)
Antibiotics for urine infection Lexapro increased to 20mg  Ativan at bedtime F/U 3 months for medications

## 2013-01-04 NOTE — Progress Notes (Signed)
  Subjective:    Patient ID: Laura English BEGIN, female    DOB: 08-May-1980, 32 y.o.   MRN: [id number]  HPI  Pt here to f/u GAD, started on lexapro last visit, feels symptoms have improved but needs dose increased. ABle to tolerate her work-day with little distraction and less panic attacks. No longer feels like she is panicking in her sleep. Her husband also recently obtained a job which takes some of the financial burden off her. Continues to have difficulty sleeping, can not wind down. Often will only get a few hours of sleep 4-5 , fatigued during day  Dysuria past 3-4 days, +pressure, +urinary frequency, +foul odor    Review of Systems  GEN- denies fatigue, fever, weight loss,weakness, recent illness ABD- denies N/V, change in stools, abd pain GU- + dysuria, denies hematuria, dribbling, incontinence MSK- denies joint pain, muscle aches, injury Neuro- denies headache, dizziness, syncope, seizure activity      Objective:   Physical Exam  GEN-NAD,alert and oriented x 3 ABD- NABS,soft,NT,ND Psych- normal affect and mood, well groomed, good eye contact  PHQ9- 4, no SI GAD 7- 6      Assessment & Plan:

## 2013-01-04 NOTE — Assessment & Plan Note (Signed)
KEFLEX GIVEN Pyridium Diflucan in case of Yeast infection

## 2013-03-01 ENCOUNTER — Telehealth: Payer: Self-pay | Admitting: Family Medicine

## 2013-03-01 NOTE — Telephone Encounter (Signed)
Pt is aware needs to make appt inn order to get another abx, last time was seen was in November

## 2013-03-01 NOTE — Telephone Encounter (Signed)
Pt is wanting to know if she can get a refill on the antibiotic that she was given a while back for her uti she believes that she has it again Call back number until [phone] or her cell [phone] Pharmacy is [facility] [address]

## 2013-03-08 ENCOUNTER — Other Ambulatory Visit: Payer: Self-pay | Admitting: Family Medicine

## 2013-04-06 ENCOUNTER — Ambulatory Visit: Payer: BC Managed Care – PPO | Admitting: Family Medicine

## 2013-06-04 ENCOUNTER — Encounter: Payer: Self-pay | Admitting: Family Medicine

## 2013-06-04 ENCOUNTER — Ambulatory Visit (INDEPENDENT_AMBULATORY_CARE_PROVIDER_SITE_OTHER): Payer: BC Managed Care – PPO | Admitting: Family Medicine

## 2013-06-04 VITALS — BP 102/58 | HR 64 | Temp 98.0°F | Resp 16 | Ht 61.0 in | Wt 118.0 lb

## 2013-06-04 DIAGNOSIS — F411 Generalized anxiety disorder: Secondary | ICD-10-CM

## 2013-06-04 DIAGNOSIS — G4700 Insomnia, unspecified: Secondary | ICD-10-CM

## 2013-06-04 DIAGNOSIS — G5600 Carpal tunnel syndrome, unspecified upper limb: Secondary | ICD-10-CM

## 2013-06-04 MED ORDER — ESCITALOPRAM OXALATE 20 MG PO TABS: 20.0000 mg | ORAL_TABLET | Freq: Every day | ORAL | Status: DC

## 2013-06-04 NOTE — Assessment & Plan Note (Signed)
Restart Lexapro at 10 mg x1 week then increase back to 20 mg. She was doing well with this dose

## 2013-06-04 NOTE — Patient Instructions (Signed)
Start the lexapro at 1/2 tablet in once a day, then increase to 1 tablet  Try melatonin for sleep  Nerve Conduction study to be done for carpal tunnel  F/U 2 months

## 2013-06-04 NOTE — Progress Notes (Signed)
Patient ID: Laura English, female   DOB: 03/30/1980, 33 y.o.   MRN: [id number]   Subjective:    Patient ID: Laura KrillKristina M English, female    DOB: 09/28/1980, 33 y.o.   MRN: [id number]  Patient presents for Medication review and refill  is here to followup anxiety. Secondary to some changes with her  prescription drug plan she was unable to get her Lexapro for the past 3 weeks. She states she was doing well the 20 mg and notices a difference off the medication as she is more angry than normal. She's also more anxious and not sleeping well. She did try the Ativan however this did not help and she wants it stay off of this medication. She wants to restart her Lexapro. Her husband did recently get a good job so this is helps some of the financial stress. She finds herself wanting to lash out at her children and some employees at work. She also complains of tingling and numbness in her right hand greater than her left.This has been going on for quite some time. She states that her hand falls asleep in she has dropped a couple of items from her hand. It is worse when she is talking on her phone or when she is handling money or things at work.    Review Of Systems:  GEN- denies fatigue, fever, weight loss,weakness, recent illness HEENT- denies eye drainage, change in vision, nasal discharge, CVS- denies chest pain, palpitations RESP- denies SOB, cough, wheeze MSK- denies joint pain, muscle aches, injury Neuro- denies headache, dizziness, syncope, seizure activity       Objective:    BP 102/58  Pulse 64  Temp(Src) 98 F (36.7 C) (Oral)  Resp 16  Ht 5\' 1"  (1.549 m)  Wt 118 lb (53.524 kg)  BMI 22.31 kg/m2  LMP 05/03/2013 GEN- NAD, alert and oriented x3 Neck- FROM NEURO- + phalens, neg Tinels, grip equal bilat, normal fist, strength equal bilat UE, tone normal UE Psych- normal affect and mood Pulses- Radial 2+        Assessment & Plan:      Problem List Items Addressed This  Visit   Insomnia     Ativan     GAD (generalized anxiety disorder) - Primary     Restart Lexapro at 10 mg x1 week then increase back to 20 mg. She was doing well with this dose       Note: This dictation was prepared with Dragon dictation along with smaller phrase technology. Any transcriptional errors that result from this process are unintentional.

## 2013-06-04 NOTE — Assessment & Plan Note (Addendum)
Discontinue the Ativan she will try melatonin as well as restart her Lexapro

## 2013-06-04 NOTE — Assessment & Plan Note (Signed)
She has been using wrist splints for some time. I will obtain nerve conduction study and we will proceed from there

## 2013-08-04 ENCOUNTER — Ambulatory Visit: Payer: BC Managed Care – PPO | Admitting: Family Medicine

## 2013-12-27 ENCOUNTER — Encounter: Payer: Self-pay | Admitting: Family Medicine

## 2014-04-19 ENCOUNTER — Encounter: Payer: Self-pay | Admitting: Family Medicine

## 2014-05-17 ENCOUNTER — Encounter: Payer: Self-pay | Admitting: Family Medicine

## 2014-09-29 ENCOUNTER — Encounter (HOSPITAL_COMMUNITY): Payer: Self-pay | Admitting: Emergency Medicine

## 2014-09-29 ENCOUNTER — Emergency Department (HOSPITAL_COMMUNITY)
Admission: EM | Admit: 2014-09-29 | Discharge: 2014-09-29 | Disposition: A | Discharge status: Home / Self Care | Payer: Self-pay | Source: Home / Self Care | Attending: Emergency Medicine | Admitting: Emergency Medicine

## 2014-09-29 DIAGNOSIS — J0110 Acute frontal sinusitis, unspecified: Secondary | ICD-10-CM

## 2014-09-29 LAB — POCT RAPID STREP A: Streptococcus, Group A Screen (Direct): NEGATIVE

## 2014-09-29 MED ORDER — AZITHROMYCIN 250 MG PO TABS: ORAL_TABLET | ORAL | Status: DC

## 2014-09-29 MED ORDER — IPRATROPIUM BROMIDE 0.06 % NA SOLN: 2.0000 | Freq: Four times a day (QID) | NASAL | Status: DC

## 2014-09-29 NOTE — Discharge Instructions (Signed)
You likely have a viral sinusitis. Use Atrovent nasal spray 4 times a day. You can take Tylenol or ibuprofen as needed for headache and ear pain. If you are not getting better in the next 5-7 days, please fill the azithromycin prescription.

## 2014-09-29 NOTE — ED Notes (Signed)
C/o sore throat and headache today.  Bilateral ears feeling full, particularly the left ear.  Several family members have been sick with uri infections

## 2014-09-29 NOTE — ED Provider Notes (Signed)
CSN: [id number]     Arrival date & time 09/29/14  1613 History   First MD Initiated Contact with Patient 09/29/14 1804     Chief Complaint  Patient presents with  . URI   (Consider location/radiation/quality/duration/timing/severity/associated sxs/prior Treatment) HPI She is a 34 year old woman here for evaluation of sinus pressure. She states her symptoms started about 3 days ago with a left earache. She reports it feels like there is water in the ear. This morning, she woke up with a sore throat, headache, and sinus pressure. She denies any congestion or rhinorrhea. No cough or shortness of breath. She reports a subjective fever, but no documented temperature. Several family members have been sick recently, one with a viral illness and 1 with a bacterial sinusitis.  She has a trip to [place] coming up.  Past Medical History  Diagnosis Date  . No pertinent past medical history   . Heartburn in pregnancy     on pepcid  . Depression     hx depression - no meds - no current problems   Past Surgical History  Procedure Laterality Date  . Cesarean section     Family History  Problem Relation Age of Onset  . Emphysema Mother   . Dementia Maternal Grandfather   . Alcohol abuse Father   . Mental illness Father   . Mental illness Brother   . Diabetes Maternal Grandmother    History  Substance Use Topics  . Smoking status: Current Every Day Smoker -- 0.50 packs/day for 10 years    Types: Cigarettes  . Smokeless tobacco: Never Used     Comment: e- cig  . Alcohol Use: No     Comment: none with pregnancy   OB History    Gravida Para Term Preterm AB TAB SAB Ectopic Multiple Living   3 3 3       3      Review of Systems As in history of present illness Allergies  Review of patient's allergies indicates no known allergies.  Home Medications   Prior to Admission medications   Medication Sig Start Date End Date Taking? Authorizing Provider  Naproxen Sodium (ALEVE PO) Take by  mouth.   Yes Historical Provider, MD  azithromycin (ZITHROMAX Z-PAK) 250 MG tablet Take 2 pills today, then 1 pill daily until gone. 09/29/14   Charm Rings, MD  escitalopram (LEXAPRO) 20 MG tablet Take 1 tablet (20 mg total) by mouth daily. 06/04/13   Salley Scarlet, MD  ipratropium (ATROVENT) 0.06 % nasal spray Place 2 sprays into both nostrils 4 (four) times daily. 09/29/14   Charm Rings, MD   BP 103/71 mmHg  Pulse 84  Temp(Src) 98 F (36.7 C) (Oral)  Resp 18  SpO2 98%  LMP 09/25/2014 Physical Exam  Constitutional: She is oriented to person, place, and time. She appears well-developed and well-nourished. No distress.  HENT:  Mouth/Throat: Oropharyngeal exudate present.  Right TM is normal. Left TM is retracted. No effusions. Bilateral frontal sinus tenderness.  Neck: Neck supple.  Cardiovascular: Normal rate, regular rhythm and normal heart sounds.   No murmur heard. Pulmonary/Chest: Effort normal and breath sounds normal. No respiratory distress. She has no wheezes. She has no rales.  Lymphadenopathy:    She has no cervical adenopathy.  Neurological: She is alert and oriented to person, place, and time.    ED Course  Procedures (including critical care time) Labs Review Labs Reviewed  POCT RAPID STREP A    Imaging Review  No results found.   MDM   1. Acute frontal sinusitis, recurrence not specified    Strep is negative. Symptomatic treatment with Atrovent nasal spray. Her symptoms do not resolve in the next 5-7 days, she will fill the prescription for azithromycin. Follow-up as needed.    Charm Rings, MD 09/29/14 [phone]

## 2014-10-02 LAB — CULTURE, GROUP A STREP: STREP A CULTURE: NEGATIVE

## 2014-10-03 NOTE — ED Notes (Signed)
Final report of strep test negative for strep 

## 2015-10-04 ENCOUNTER — Encounter (HOSPITAL_COMMUNITY): Payer: Self-pay | Admitting: Emergency Medicine

## 2015-10-04 ENCOUNTER — Emergency Department (HOSPITAL_COMMUNITY): Payer: Medicaid Other

## 2015-10-04 ENCOUNTER — Emergency Department (HOSPITAL_COMMUNITY)
Admission: EM | Admit: 2015-10-04 | Discharge: 2015-10-04 | Disposition: A | Discharge status: Home / Self Care | Payer: Medicaid Other | Attending: Emergency Medicine | Admitting: Emergency Medicine

## 2015-10-04 DIAGNOSIS — F1290 Cannabis use, unspecified, uncomplicated: Secondary | ICD-10-CM | POA: Insufficient documentation

## 2015-10-04 DIAGNOSIS — Z79899 Other long term (current) drug therapy: Secondary | ICD-10-CM | POA: Insufficient documentation

## 2015-10-04 DIAGNOSIS — M79671 Pain in right foot: Secondary | ICD-10-CM | POA: Insufficient documentation

## 2015-10-04 DIAGNOSIS — F17210 Nicotine dependence, cigarettes, uncomplicated: Secondary | ICD-10-CM | POA: Insufficient documentation

## 2015-10-04 DIAGNOSIS — Z792 Long term (current) use of antibiotics: Secondary | ICD-10-CM | POA: Insufficient documentation

## 2015-10-04 MED ORDER — NAPROXEN 500 MG PO TABS: 500.0000 mg | ORAL_TABLET | Freq: Two times a day (BID) | ORAL | 0 refills | Status: DC

## 2015-10-04 NOTE — ED Triage Notes (Signed)
Patient with right foot pain off and on for six weeks.  No injury reported, but patient say she has been wearing high heels more than usual because of a new job.  Color is normal, slight swelling present.  Pain worse with walking and driving.  Patient has tried Aleve with no relief.  Pain is a 7/10 at its worse.  Pain is a 4/10 now.

## 2015-10-04 NOTE — Discharge Instructions (Signed)
Take your medication as prescribed as needed for pain relief. I recommend resting, elevating and applying ice to her foot for 15-20 minutes 3-4 times daily to help with pain and swelling. Refrain from wearing heels for the next few weeks until your pain has resolved. Follow-up with your family doctor in the next week if your symptoms have not improved. Return to the emergency department if symptoms worsen or new onset of fever, redness, swelling, warmth, numbness, tingling, weakness.

## 2015-10-04 NOTE — ED Provider Notes (Signed)
[facility]-EMERGENCY DEPT Provider Note   CSN: [id number] Arrival date & time: 10/04/15  1409  First Provider Contact:  None    By signing my name below, I, Majel Homer, attest that this documentation has been prepared under the direction and in the presence of non-physician practitioner, Melburn Hake, PA-C. Electronically Signed: Majel Homer, Scribe. 10/04/2015. 2:49 PM.  History   Chief Complaint Chief Complaint  Patient presents with  . Foot Pain   The history is provided by the patient. No language interpreter was used.   HPI Comments: Laura English is a 35 y.o. female who presents to the Emergency Department complaining of gradually worsening, intermittent, right foot pain that began 6 weeks ago and worsened over the past 1-2 weeks. Pt reports her pain begins near the base of her middle foot and radiates upwards towards her ankle; she notes associated swelling to the top of her foot. She states her pain is exacerbated when bending her foot in certain directions, walking and driving. She notes she has taken aleve to reduce her pain with mild relief. She states she began wearing multiple types of high heeled shoes ~6 months ago for her new job but has since stopped wearing them since the onset of her pain 6 weeks ago. She denies any recent injury or falls, numbness or tingling in her extremities, and pain in her heel, sole or toes of her right foot.   Past Medical History:  Diagnosis Date  . Depression    hx depression - no meds - no current problems  . Heartburn in pregnancy    on pepcid  . No pertinent past medical history    Patient Active Problem List   Diagnosis Date Noted  . Carpal tunnel syndrome 06/04/2013  . Insomnia 01/04/2013  . Routine general medical examination at a health care facility 12/07/2012  . GAD (generalized anxiety disorder) 12/07/2012  . Hip pain 12/07/2012    Past Surgical History:  Procedure Laterality Date  . CESAREAN SECTION      OB History    Gravida Para Term Preterm AB Living   SAB TAB Ectopic Multiple Live Births           1     Home Medications    Prior to Admission medications   Medication Sig Start Date End Date Taking? Authorizing Provider  azithromycin (ZITHROMAX Z-PAK) 250 MG tablet Take 2 pills today, then 1 pill daily until gone. 09/29/14   Charm Rings, MD  escitalopram (LEXAPRO) 20 MG tablet Take 1 tablet (20 mg total) by mouth daily. 06/04/13   Salley Scarlet, MD  ipratropium (ATROVENT) 0.06 % nasal spray Place 2 sprays into both nostrils 4 (four) times daily. 09/29/14   Charm Rings, MD  naproxen (NAPROSYN) 500 MG tablet Take 1 tablet (500 mg total) by mouth 2 (two) times daily with a meal. 10/04/15   Barrett Henle, PA-C  Naproxen Sodium (ALEVE PO) Take by mouth.    Historical Provider, MD   Family History Family History  Problem Relation Age of Onset  . Emphysema Mother   . Dementia Maternal Grandfather   . Alcohol abuse Father   . Mental illness Father   . Mental illness Brother   . Diabetes Maternal Grandmother    Social History Social History  Substance Use Topics  . Smoking status: Current Every Day Smoker    Packs/day: 0.50    Years: 10.00  Types: Cigarettes  . Smokeless tobacco: Never Used     Comment: e- cig  . Alcohol use No     Comment: none with pregnancy   Allergies   Review of patient's allergies indicates no known allergies.  Review of Systems Review of Systems  Physical Exam Updated Vital Signs BP 104/70 (BP Location: Left Arm)   Pulse 77   Temp 98.3 F (36.8 C) (Oral)   Resp 16   Wt 120 lb (54.4 kg)   SpO2 99%   BMI 22.67 kg/m   Physical Exam  Constitutional: She is oriented to person, place, and time. She appears well-developed and well-nourished.  HENT:  Head: Normocephalic and atraumatic.  Eyes: Conjunctivae and EOM are normal. Right eye exhibits no discharge. Left eye exhibits no discharge. No scleral icterus.  Pulmonary/Chest: Effort  normal.  Musculoskeletal:       Right ankle: She exhibits normal range of motion, no swelling, no ecchymosis, no deformity, no laceration and normal pulse. Tenderness. Achilles tendon normal.       Right foot: There is tenderness. There is normal range of motion, no swelling, normal capillary refill, no crepitus, no deformity and no laceration.       Feet:  Mild TTP over volar aspect of right third and fourth metatarsals and right anterior ankle. Full ROM of right knee, ankle, foot and toes. 5/5 strength; 2+ DP pulse; cap refill less than 2; sensation grossly intact. Pt able to stand and ambulate but endorses pain.  No swelling, erythema, warmth or ecchymosis noted.   Neurological: She is alert and oriented to person, place, and time.  Nursing note and vitals reviewed.  ED Treatments / Results  Labs (all labs ordered are listed, but only abnormal results are displayed) Labs Reviewed - No data to display  EKG  EKG Interpretation None       Radiology Dg Ankle Complete Right  Result Date: 10/04/2015 CLINICAL DATA:  Right foot and ankle pain six weeks. EXAM: RIGHT ANKLE - COMPLETE 3+ VIEW COMPARISON:  None. FINDINGS: There is no evidence of fracture, dislocation, or joint effusion. There is no evidence of arthropathy or other focal bone abnormality. Soft tissues are unremarkable. IMPRESSION: Negative. Electronically Signed   By: Marnee SpringJonathon  Watts M.D.   On: 10/04/2015 14:58   Dg Foot Complete Right  Result Date: 10/04/2015 CLINICAL DATA:  Six week history of right foot pain. EXAM: RIGHT FOOT COMPLETE - 3+ VIEW COMPARISON:  None. FINDINGS: There is no evidence of fracture or dislocation. There is no evidence of arthropathy or other focal bone abnormality. Soft tissues are unremarkable. IMPRESSION: Normal exam. Electronically Signed   By: Francene BoyersJames  Maxwell M.D.   On: 10/04/2015 14:58    Procedures Procedures  DIAGNOSTIC STUDIES:  Oxygen Saturation is 99% on RA, normal by my interpretation.      COORDINATION OF CARE:  2:41 PM Discussed treatment plan, which includes X-ray of right foot with pt at bedside and pt agreed to plan.  Medications Ordered in ED Medications - No data to display  Initial Impression / Assessment and Plan / ED Course  I have reviewed the triage vital signs and the nursing notes.  Pertinent labs & imaging results that were available during my care of the patient were reviewed by me and considered in my medical decision making (see chart for details).  Clinical Course    Patient presents with worsening right foot pain that started 6 weeks ago after wearing heels at her new job. Denies  any other recent fall or injury. VSS. Exam revealed mild tenderness over right mid foot and anterior ankle, full range of motion, remaining exam unremarkable. Right lower extremity neurovascularly intact. Patient able to stand and ambulate but endorses pain. Right foot and ankle x-ray negative. Plan to discharge patient home with symptomatically treatment including NSAIDs and RICE protocol. Advised patient to refrain from wearing heels until her symptoms have improved. Advised her to follow up with PCP as needed. Discussed return precautions with patient.  I personally performed the services described in this documentation, which was scribed in my presence. The recorded information has been reviewed and is accurate.   Final Clinical Impressions(s) / ED Diagnoses   Final diagnoses:  Right foot pain   New Prescriptions Discharge Medication List as of 10/04/2015  3:27 PM    START taking these medications   Details  naproxen (NAPROSYN) 500 MG tablet Take 1 tablet (500 mg total) by mouth 2 (two) times daily with a meal., Starting Wed 10/04/2015, Print         Satira Sark [place], [place] 10/04/15 1559    Geoffery Lyons, MD 10/04/15 2044

## 2016-05-20 ENCOUNTER — Emergency Department (HOSPITAL_COMMUNITY)
Admission: EM | Admit: 2016-05-20 | Discharge: 2016-05-20 | Disposition: A | Discharge status: Home / Self Care | Payer: Medicaid Other | Attending: Emergency Medicine | Admitting: Emergency Medicine

## 2016-05-20 ENCOUNTER — Encounter (HOSPITAL_COMMUNITY): Payer: Self-pay | Admitting: Emergency Medicine

## 2016-05-20 DIAGNOSIS — F17210 Nicotine dependence, cigarettes, uncomplicated: Secondary | ICD-10-CM | POA: Insufficient documentation

## 2016-05-20 DIAGNOSIS — G8929 Other chronic pain: Secondary | ICD-10-CM | POA: Insufficient documentation

## 2016-05-20 DIAGNOSIS — Z79899 Other long term (current) drug therapy: Secondary | ICD-10-CM | POA: Insufficient documentation

## 2016-05-20 DIAGNOSIS — M79671 Pain in right foot: Secondary | ICD-10-CM | POA: Insufficient documentation

## 2016-05-20 NOTE — ED Triage Notes (Signed)
Pt presents with acute chronic right foot pain for past year; denies injury.

## 2016-05-20 NOTE — ED Provider Notes (Signed)
[facility]-EMERGENCY DEPT Provider Note   CSN: [id number] Arrival date & time: 05/20/16  1507  By signing my name below, I, Laura English, attest that this documentation has been prepared under the direction and in the presence of non-physician practitioner, Laura Bayley, PA-C. Electronically Signed: Modena English, Scribe. 05/20/2016. 4:58 PM.  History   Chief Complaint Chief Complaint  Patient presents with  . Foot Pain   The history is provided by the patient. No language interpreter was used.   HPI Comments: Laura English is a 36 y.o. female who presents to the Emergency Department complaining of intermittent moderate right foot pain that Returned about 2.5 weeks ago. Her pain began when she started wearing high-heels at her job several months ago. She was seen in the ED on 10/04/15 for the same complaint and patient states she had the pain for about 2-3 months prior to this visit. Her right foot/ankle radiology was negative at the time. No known trauma since last visit. She tried rest, elevation, ice, heat, and over-the-counter pain medications with no relief. Patient has also tried orthotics which helped for a time, however her pain has returned. She denies any fever, swelling, numbness/tingling, or other complaints.     PCP: Milinda Antis, MD  Past Medical History:  Diagnosis Date  . Depression    hx depression - no meds - no current problems  . Heartburn in pregnancy    on pepcid  . No pertinent past medical history     Patient Active Problem List   Diagnosis Date Noted  . Carpal tunnel syndrome 06/04/2013  . Insomnia 01/04/2013  . Routine general medical examination at a health care facility 12/07/2012  . GAD (generalized anxiety disorder) 12/07/2012  . Hip pain 12/07/2012    Past Surgical History:  Procedure Laterality Date  . CESAREAN SECTION      OB History    Gravida Para Term Preterm AB Living   3 3 3     3    SAB TAB Ectopic Multiple Live Births           1         Home Medications    Prior to Admission medications   Medication Sig Start Date End Date Taking? Authorizing Provider  azithromycin (ZITHROMAX Z-PAK) 250 MG tablet Take 2 pills today, then 1 pill daily until gone. 09/29/14   Charm Rings, MD  escitalopram (LEXAPRO) 20 MG tablet Take 1 tablet (20 mg total) by mouth daily. 06/04/13   Salley Scarlet, MD  ipratropium (ATROVENT) 0.06 % nasal spray Place 2 sprays into both nostrils 4 (four) times daily. 09/29/14   Charm Rings, MD  naproxen (NAPROSYN) 500 MG tablet Take 1 tablet (500 mg total) by mouth 2 (two) times daily with a meal. 10/04/15   Barrett Henle, PA-C  Naproxen Sodium (ALEVE PO) Take by mouth.    Historical Provider, MD    Family History Family History  Problem Relation Age of Onset  . Emphysema Mother   . Dementia Maternal Grandfather   . Alcohol abuse Father   . Mental illness Father   . Mental illness Brother   . Diabetes Maternal Grandmother     Social History Social History  Substance Use Topics  . Smoking status: Current Every Day Smoker    Packs/day: 0.50    Years: 10.00    Types: Cigarettes  . Smokeless tobacco: Never Used     Comment: e- cig  . Alcohol use No  Comment: none with pregnancy     Allergies   Patient has no known allergies.   Review of Systems Review of Systems  Constitutional: Negative for fever.  Musculoskeletal: Positive for arthralgias (Right foot) and myalgias (Right foot). Negative for joint swelling.  Neurological: Negative for numbness.     Physical Exam Updated Vital Signs BP 107/62 (BP Location: Left Arm)   Pulse 67   Temp 98.1 F (36.7 C) (Oral)   Resp 18   LMP 05/18/2016   SpO2 99%   Physical Exam  Constitutional: She appears well-developed and well-nourished. No distress.  HENT:  Head: Normocephalic and atraumatic.  Mouth/Throat: Oropharynx is clear and moist. No oropharyngeal exudate.  Eyes: Conjunctivae are normal. Pupils are equal, round, and  reactive to light. Right eye exhibits no discharge. Left eye exhibits no discharge. No scleral icterus.  Neck: Normal range of motion. Neck supple. No thyromegaly present.  Cardiovascular: Normal rate, regular rhythm, normal heart sounds and intact distal pulses.  Exam reveals no gallop and no friction rub.   No murmur heard. Pulmonary/Chest: Effort normal and breath sounds normal. No stridor. No respiratory distress. She has no wheezes. She has no rales.  Abdominal: Soft. Bowel sounds are normal. She exhibits no distension. There is no tenderness. There is no rebound and no guarding.  Musculoskeletal: She exhibits no edema.       Feet:  TTP to the dorsum of right foot. Normal sensation. Cap. refill intact. Plantar and dorsiflexion intact with some pain. Able to move toes freely. DP pulses intact. No ankle or Achilles' tendon tenderness. No tenderness to the heel or plantar fascia.  Lymphadenopathy:    She has no cervical adenopathy.  Neurological: She is alert. Coordination normal.  Skin: Skin is warm and dry. No rash noted. She is not diaphoretic. No pallor.  Psychiatric: She has a normal mood and affect.  Nursing note and vitals reviewed.    ED Treatments / Results  DIAGNOSTIC STUDIES: Oxygen Saturation is 99% on RA, normal by my interpretation.    COORDINATION OF CARE: 5:03 PM- Pt advised of plan for treatment and pt agrees.  Labs (all labs ordered are listed, but only abnormal results are displayed) Labs Reviewed - No data to display  EKG  EKG Interpretation None       Radiology No results found.  Procedures Procedures (including critical care time)  Medications Ordered in ED Medications - No data to display   Initial Impression / Assessment and Plan / ED Course  I have reviewed the triage vital signs and the nursing notes.  Pertinent labs & imaging results that were available during my care of the patient were reviewed by me and considered in my medical  decision making (see chart for details).     Patient with chronic right foot pain. No new injury to indicate repeat imaging. No warmth or erythema or signs of infection. Patient afebrile. We'll refer patient to podiatry for further evaluation and treatment. Patient given postop shoe for support. Return precautions discussed. Patient understands and agrees with plan. Patient vitals stable throughout ED course and discharged in satisfactory condition.  Final Clinical Impressions(s) / ED Diagnoses   Final diagnoses:  Chronic pain in right foot    New Prescriptions New Prescriptions   No medications on file   I personally performed the services described in this documentation, which was scribed in my presence. The recorded information has been reviewed and is accurate.     Emi HolesAlexandra M Armel Rabbani, PA-C  05/20/16 1719    Arby Barrette, MD 05/26/16 [phone]

## 2016-05-20 NOTE — Discharge Instructions (Signed)
Please follow-up with one of the podiatrists as outlined below. Continue the treatment you have at home. You may find it helpful to use the postop shoe for more support. Please return to emergency department if you develop any new or worsening symptoms.

## 2016-05-20 NOTE — ED Notes (Signed)
Notified Jon, ortho tech, of order.

## 2017-10-21 ENCOUNTER — Emergency Department (HOSPITAL_COMMUNITY)
Admission: EM | Admit: 2017-10-21 | Discharge: 2017-10-22 | Disposition: A | Discharge status: Home / Self Care | Payer: Self-pay | Attending: Emergency Medicine | Admitting: Emergency Medicine

## 2017-10-21 ENCOUNTER — Encounter (HOSPITAL_COMMUNITY): Payer: Self-pay | Admitting: Emergency Medicine

## 2017-10-21 DIAGNOSIS — F17210 Nicotine dependence, cigarettes, uncomplicated: Secondary | ICD-10-CM | POA: Insufficient documentation

## 2017-10-21 DIAGNOSIS — F329 Major depressive disorder, single episode, unspecified: Secondary | ICD-10-CM | POA: Insufficient documentation

## 2017-10-21 DIAGNOSIS — R45851 Suicidal ideations: Secondary | ICD-10-CM | POA: Insufficient documentation

## 2017-10-21 DIAGNOSIS — F1510 Other stimulant abuse, uncomplicated: Secondary | ICD-10-CM | POA: Insufficient documentation

## 2017-10-21 DIAGNOSIS — Z79899 Other long term (current) drug therapy: Secondary | ICD-10-CM | POA: Insufficient documentation

## 2017-10-21 DIAGNOSIS — F1994 Other psychoactive substance use, unspecified with psychoactive substance-induced mood disorder: Secondary | ICD-10-CM | POA: Diagnosis present

## 2017-10-21 DIAGNOSIS — F32A Depression, unspecified: Secondary | ICD-10-CM

## 2017-10-21 LAB — CBC WITH DIFFERENTIAL/PLATELET
BASOS PCT: 0 %
Basophils Absolute: 0.1 10*3/uL (ref 0.0–0.1)
EOS ABS: 0.4 10*3/uL (ref 0.0–0.7)
Eosinophils Relative: 3 %
HCT: 41.7 % (ref 36.0–46.0)
HEMOGLOBIN: 14.1 g/dL (ref 12.0–15.0)
Lymphocytes Relative: 40 %
Lymphs Abs: 4.8 10*3/uL — ABNORMAL HIGH (ref 0.7–4.0)
MCH: 32.6 pg (ref 26.0–34.0)
MCHC: 33.8 g/dL (ref 30.0–36.0)
MCV: 96.3 fL (ref 78.0–100.0)
MONO ABS: 1 10*3/uL (ref 0.1–1.0)
MONOS PCT: 8 %
NEUTROS PCT: 49 %
Neutro Abs: 5.8 10*3/uL (ref 1.7–7.7)
PLATELETS: 450 10*3/uL — AB (ref 150–400)
RBC: 4.33 MIL/uL (ref 3.87–5.11)
RDW: 14 % (ref 11.5–15.5)
WBC: 12 10*3/uL — ABNORMAL HIGH (ref 4.0–10.5)

## 2017-10-21 LAB — URINALYSIS, ROUTINE W REFLEX MICROSCOPIC
Bilirubin Urine: NEGATIVE
Glucose, UA: NEGATIVE mg/dL
Hgb urine dipstick: NEGATIVE
Ketones, ur: NEGATIVE mg/dL
Nitrite: NEGATIVE
Protein, ur: NEGATIVE mg/dL
SPECIFIC GRAVITY, URINE: 1.003 — AB (ref 1.005–1.030)
pH: 6 (ref 5.0–8.0)

## 2017-10-21 LAB — COMPREHENSIVE METABOLIC PANEL
ALT: 15 U/L (ref 0–44)
AST: 18 U/L (ref 15–41)
Albumin: 3.7 g/dL (ref 3.5–5.0)
Alkaline Phosphatase: 58 U/L (ref 38–126)
Anion gap: 4 — ABNORMAL LOW (ref 5–15)
BUN: 6 mg/dL (ref 6–20)
CO2: 28 mmol/L (ref 22–32)
CREATININE: 0.73 mg/dL (ref 0.44–1.00)
Calcium: 9.2 mg/dL (ref 8.9–10.3)
Chloride: 109 mmol/L (ref 98–111)
GFR calc non Af Amer: >60 mL/min (ref >60)
Glucose, Bld: 95 mg/dL (ref 70–99)
Potassium: 4.3 mmol/L (ref 3.5–5.1)
Sodium: 141 mmol/L (ref 135–145)
Total Bilirubin: 0.3 mg/dL (ref 0.3–1.2)
Total Protein: 7 g/dL (ref 6.5–8.1)

## 2017-10-21 LAB — RAPID URINE DRUG SCREEN, HOSP PERFORMED
Amphetamines: POSITIVE — AB
Barbiturates: NOT DETECTED
Benzodiazepines: NOT DETECTED
Cocaine: NOT DETECTED
OPIATES: NOT DETECTED
Tetrahydrocannabinol: POSITIVE — AB

## 2017-10-21 LAB — ETHANOL: Alcohol, Ethyl (B): <10 mg/dL (ref <10)

## 2017-10-21 LAB — I-STAT BETA HCG BLOOD, ED (MC, WL, AP ONLY): I-stat hCG, quantitative: <5 m[IU]/mL (ref <5)

## 2017-10-21 MED ORDER — NICOTINE 21 MG/24HR TD PT24
21.0000 mg | MEDICATED_PATCH | Freq: Once | TRANSDERMAL | Status: DC
  Administered 2017-10-21: 21 mg via TRANSDERMAL
  Filled 2017-10-21 (×2): qty 1

## 2017-10-21 MED ORDER — LORAZEPAM 1 MG PO TABS
1.0000 mg | ORAL_TABLET | Freq: Once | ORAL | Status: AC
  Administered 2017-10-21: 1 mg via ORAL
  Filled 2017-10-21: qty 1

## 2017-10-21 NOTE — ED Notes (Signed)
Unable to assess pt resulted to sleeping. No noted distress. Staff will continue to assess.

## 2017-10-21 NOTE — ED Notes (Signed)
Bed: [facility] Expected date:  Expected time:  Means of arrival:  Comments: 

## 2017-10-21 NOTE — ED Notes (Signed)
[facility] Expected date:  Expected time:  Means of arrival:  Comments: Margo AyeHall B
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
## 2017-10-21 NOTE — ED Notes (Signed)
Pt to room 39 ambulatory

## 2017-10-21 NOTE — ED Notes (Signed)
Bed: [facility] Expected date:  Expected time:  Means of arrival:  Comments: 

## 2017-10-21 NOTE — ED Notes (Signed)
Report given to Toniann FailWendy, Charity fundraiserN. Patient can go to room 39.
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
## 2017-10-22 DIAGNOSIS — F1994 Other psychoactive substance use, unspecified with psychoactive substance-induced mood disorder: Secondary | ICD-10-CM | POA: Diagnosis present

## 2017-10-22 LAB — HEPATITIS PANEL, ACUTE
HCV Ab: <0.1 s/co ratio (ref 0.0–0.9)
Hep A IgM: NEGATIVE
Hep B C IgM: NEGATIVE
Hepatitis B Surface Ag: NEGATIVE

## 2017-10-22 LAB — RPR: RPR: NONREACTIVE

## 2017-10-22 LAB — HIV ANTIBODY (ROUTINE TESTING W REFLEX): HIV Screen 4th Generation wRfx: NONREACTIVE

## 2017-10-22 NOTE — BH Assessment (Signed)
[facility]  Per Juanetta BeetsJacqueline Norman, DO, this pt does not require psychiatric hospitalization at this time.  [facility]  This has been included in pt's discharge instructions.  Pt would also benefit from seeing Peer Support Specialists; they will be asked to speak to pt.  Pt's nurse, Wille CelesteJanie, has been notified.  Doylene Canninghomas Biridiana Twardowski, MA Triage Specialist [phone]

## 2017-10-22 NOTE — ED Notes (Signed)
Pt slept throughout the shift. No noted distress.

## 2017-10-22 NOTE — Discharge Instructions (Signed)
To help you maintain a sober lifestyle, a substance abuse treatment program may be beneficial to you.  Contact [facility] at your earliest opportunity to ask about enrolling in their program: ° °     [facility] °     [address] °     [place], [place] [address] °     [phone] °     New patients are seen at the walk-in clinic every Tuesday from 9:00 am - 12:00 pm °

## 2017-10-22 NOTE — ED Notes (Signed)
Disposition Initial Assessment Completed for this Encounter: Yes  Nira ConnJason Berry, NP, recommended overnight observation for safety with peer support in the a.m. Doctor and RN notified of patient disposition.
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
## 2017-10-22 NOTE — ED Notes (Signed)
Up tot he bathroom to shower and change scrubs 
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
## 2017-10-22 NOTE — ED Notes (Signed)
Friend into see 

## 2017-10-22 NOTE — ED Notes (Signed)
Dr Sharma CovertNorman and Feliz Beamravis NP into see. Pt sleeping, easily aroused, reports that she is not as angry as she was yesterday.  Pt denies si/hi/avh at this time and reports that the last time she used meth was Sunday.  Pt reports that she lives with her mother, is interested in rehab and does not want to use meth anymore.

## 2017-10-22 NOTE — ED Notes (Addendum)
Written dc instructions reviewed with patient.  Pt encouraged to follow up with OP referral that have been provided as soon as possible.  Pt verbalized understanding.  Pt ambulatory with out difficulty to dc area with mHt, belongings returned after leaving the area.

## 2017-10-22 NOTE — ED Notes (Signed)
On the phone 

## 2017-11-19 ENCOUNTER — Ambulatory Visit: Payer: Self-pay | Attending: Family Medicine | Admitting: Family Medicine

## 2017-11-19 ENCOUNTER — Inpatient Hospital Stay: Payer: Self-pay

## 2017-11-19 VITALS — BP 93/63 | HR 83 | Temp 97.5°F | Resp 17 | Wt 110.6 lb

## 2017-11-19 DIAGNOSIS — F411 Generalized anxiety disorder: Secondary | ICD-10-CM | POA: Insufficient documentation

## 2017-11-19 DIAGNOSIS — F17210 Nicotine dependence, cigarettes, uncomplicated: Secondary | ICD-10-CM | POA: Insufficient documentation

## 2017-11-19 DIAGNOSIS — F32A Depression, unspecified: Secondary | ICD-10-CM

## 2017-11-19 DIAGNOSIS — G5600 Carpal tunnel syndrome, unspecified upper limb: Secondary | ICD-10-CM | POA: Insufficient documentation

## 2017-11-19 DIAGNOSIS — F329 Major depressive disorder, single episode, unspecified: Secondary | ICD-10-CM | POA: Insufficient documentation

## 2017-11-19 DIAGNOSIS — R350 Frequency of micturition: Secondary | ICD-10-CM

## 2017-11-19 DIAGNOSIS — F1911 Other psychoactive substance abuse, in remission: Secondary | ICD-10-CM | POA: Insufficient documentation

## 2017-11-19 DIAGNOSIS — Z87898 Personal history of other specified conditions: Secondary | ICD-10-CM

## 2017-11-19 LAB — POCT URINALYSIS DIP (MANUAL ENTRY)
BILIRUBIN UA: NEGATIVE
BILIRUBIN UA: NEGATIVE mg/dL
GLUCOSE UA: NEGATIVE mg/dL
Leukocytes, UA: NEGATIVE
Nitrite, UA: NEGATIVE
Protein Ur, POC: NEGATIVE mg/dL
RBC UA: NEGATIVE
SPEC GRAV UA: 1.01 (ref 1.010–1.025)
Urobilinogen, UA: 0.2 E.U./dL
pH, UA: 7 (ref 5.0–8.0)

## 2017-11-19 MED ORDER — ESCITALOPRAM OXALATE 10 MG PO TABS: 10.0000 mg | ORAL_TABLET | Freq: Every day | ORAL | 0 refills | Status: DC

## 2017-11-19 MED ORDER — BUSPIRONE HCL 10 MG PO TABS: 5.0000 mg | ORAL_TABLET | Freq: Three times a day (TID) | ORAL | 0 refills | Status: DC

## 2017-11-19 MED FILL — busPIRone HCL 10 MG TABS: 10 | 30 days supply | Qty: 45 | Fill #0

## 2017-11-19 MED FILL — ESCITALOPRAM 10 MG TABLET: 10 | 33 days supply | Qty: 60 | Fill #0

## 2017-11-19 NOTE — Patient Instructions (Addendum)
Congratulation on sobriety! I recommend contacting one of the following resources for mental health counseling.  Local mental health resources:   [facility] [place], [place] [phone]  [facility] [phone]  [facility] [place], [place] [phone]  Contact the National Suicide Prevention Hotline 828-614-9672 if you at any time you are experiencing thoughts or feelings of suicide.  Start Lexapro 10 mg once daily at bedtime for 7 days and increase dose to Lexapro 20 mg daily at bedtime on day 8.  Return in 4 weeks for a medication follow-up evaluation. Schedule a physical exam.     Generalized Anxiety Disorder, Adult Generalized anxiety disorder (GAD) is a mental health disorder. People with this condition constantly worry about everyday events. Unlike normal anxiety, worry related to GAD is not triggered by a specific event. These worries also do not fade or get better with time. GAD interferes with life functions, including relationships, work, and school. GAD can vary from mild to severe. People with severe GAD can have intense waves of anxiety with physical symptoms (panic attacks). What are the causes? The exact cause of GAD is not known. What increases the risk? This condition is more likely to develop in:  Women.  People who have a family history of anxiety disorders.  People who are very shy.  People who experience very stressful life events, such as the death of a loved one.  People who have a very stressful family environment.  What are the signs or symptoms? People with GAD often worry excessively about many things in their lives, such as their health and family. They may also be overly concerned about:  Doing well at work.  Being on time.  Natural disasters.  Friendships.  Physical symptoms of GAD include:  Fatigue.  Muscle tension or having muscle  twitches.  Trembling or feeling shaky.  Being easily startled.  Feeling like your heart is pounding or racing.  Feeling out of breath or like you cannot take a deep breath.  Having trouble falling asleep or staying asleep.  Sweating.  Nausea, diarrhea, or irritable bowel syndrome (IBS).  Headaches.  Trouble concentrating or remembering facts.  Restlessness.  Irritability.  How is this diagnosed? Your health care provider can diagnose GAD based on your symptoms and medical history. You will also have a physical exam. The health care provider will ask specific questions about your symptoms, including how severe they are, when they started, and if they come and go. Your health care provider may ask you about your use of alcohol or drugs, including prescription medicines. Your health care provider may refer you to a mental health specialist for further evaluation. Your health care provider will do a thorough examination and may perform additional tests to rule out other possible causes of your symptoms. To be diagnosed with GAD, a person must have anxiety that:  Is out of his or her control.  Affects several different aspects of his or her life, such as work and relationships.  Causes distress that makes him or her unable to take part in normal activities.  Includes at least three physical symptoms of GAD, such as restlessness, fatigue, trouble concentrating, irritability, muscle tension, or sleep problems.  Before your health care provider can confirm a diagnosis of GAD, these symptoms must be present more days than they are not, and they must last for six months or longer. How is this treated? The following therapies are usually used to  treat GAD:  Medicine. Antidepressant medicine is usually prescribed for long-term daily control. Antianxiety medicines may be added in severe cases, especially when panic attacks occur.  Talk therapy (psychotherapy). Certain types of talk  therapy can be helpful in treating GAD by providing support, education, and guidance. Options include: ? Cognitive behavioral therapy (CBT). People learn coping skills and techniques to ease their anxiety. They learn to identify unrealistic or negative thoughts and behaviors and to replace them with positive ones. ? Acceptance and commitment therapy (ACT). This treatment teaches people how to be mindful as a way to cope with unwanted thoughts and feelings. ? Biofeedback. This process trains you to manage your body's response (physiological response) through breathing techniques and relaxation methods. You will work with a therapist while machines are used to monitor your physical symptoms.  Stress management techniques. These include yoga, meditation, and exercise.  A mental health specialist can help determine which treatment is best for you. Some people see improvement with one type of therapy. However, other people require a combination of therapies. Follow these instructions at home:  Take over-the-counter and prescription medicines only as told by your health care provider.  Try to maintain a normal routine.  Try to anticipate stressful situations and allow extra time to manage them.  Practice any stress management or self-calming techniques as taught by your health care provider.  Do not punish yourself for setbacks or for not making progress.  Try to recognize your accomplishments, even if they are small.  Keep all follow-up visits as told by your health care provider. This is important. Contact a health care provider if:  Your symptoms do not get better.  Your symptoms get worse.  You have signs of depression, such as: ? A persistently sad, cranky, or irritable mood. ? Loss of enjoyment in activities that used to bring you joy. ? Change in weight or eating. ? Changes in sleeping habits. ? Avoiding friends or family members. ? Loss of energy for normal tasks. ? Feelings of  guilt or worthlessness. Get help right away if:  You have serious thoughts about hurting yourself or others. If you ever feel like you may hurt yourself or others, or have thoughts about taking your own life, get help right away. You can go to your nearest emergency department or call:  Your local emergency services (911 in the [place]  A suicide crisis helpline, such as the National Suicide Prevention Lifeline at 703 863 2327. This is open 24 hours a day.  Summary  Generalized anxiety disorder (GAD) is a mental health disorder that involves worry that is not triggered by a specific event.  People with GAD often worry excessively about many things in their lives, such as their health and family.  GAD may cause physical symptoms such as restlessness, trouble concentrating, sleep problems, frequent sweating, nausea, diarrhea, headaches, and trembling or muscle twitching.  A mental health specialist can help determine which treatment is best for you. Some people see improvement with one type of therapy. However, other people require a combination of therapies. This information is not intended to replace advice given to you by your health care provider. Make sure you discuss any questions you have with your health care provider. Document Released: 06/08/2012 Document Revised: 01/02/2016 Document Reviewed: 01/02/2016 Elsevier Interactive Patient Education  2018 [employer].  Persistent Depressive Disorder Persistent depressive disorder (PDD) is a mental health condition. PDD causes symptoms of low-level depression for 2 years or longer. It may also be called long-term (  chronic) depression or dysthymia. PDD may include episodes of more severe depression that last for about 2 weeks (major depressive disorder or MDD). PDD can affect the way you think, feel, and sleep. This condition may also affect your relationships. You may be more likely to get sick if you have PDD. Symptoms of PDD occur for  most of the day and may include:  Feeling tired (fatigue).  Low energy.  Eating too much or too little.  Sleeping too much or too little.  Feeling restless or agitated.  Feeling hopeless.  Feeling worthless or guilty.  Feeling worried or nervous (anxiety).  Trouble concentrating or making decisions.  Low self-esteem.  A negative way of looking at things (outlook).  Not being able to have fun or feel pleasure.  Avoiding interacting with people.  Getting angry or annoyed easily (irritability).  Acting aggressive or angry.  Follow these instructions at home: Activity  Go back to your normal activities as told by your doctor.  Exercise regularly as told by your doctor. General instructions  Take over-the-counter and prescription medicines only as told by your doctor.  Do not drink alcohol. Or, limit how much alcohol you drink to no more than 1 drink a day for nonpregnant women and 2 drinks a day for men. One drink equals 12 oz of beer, 5 oz of wine, or 1 oz of hard liquor. Alcohol can affect any antidepressant medicines you are taking. Talk with your doctor about your alcohol use.  Eat a healthy diet and get plenty of sleep.  Find activities that you enjoy each day.  Consider joining a support group. Your doctor may be able to suggest a support group.  Keep all follow-up visits as told by your doctor. This is important. Where to find more information: [employer] on Mental Illness  www.nami.org  [employer] of Mental Health  http://www.maynard.net/  National Suicide Prevention Lifeline  636 043 2991 779-708-3590). This is free, 24-hour help.  Contact a doctor if:  Your symptoms get worse.  You have new symptoms.  You have trouble sleeping or doing your daily activities. Get help right away if:  You self-harm.  You have serious thoughts about hurting yourself or others.  You see, hear, taste, smell, or feel things that are not  there (hallucinate). This information is not intended to replace advice given to you by your health care provider. Make sure you discuss any questions you have with your health care provider. Document Released: 01/23/2015 Document Revised: 10/06/2015 Document Reviewed: 10/06/2015 Elsevier Interactive Patient Education  2017 [employer].   Finding Treatment for Addiction What is addiction? Addiction is a complex disease of the brain. It causes an uncontrollable (compulsive) need for a substance. You can be addicted to alcohol, illegal drugs, or prescription medicines such as painkillers. Addiction can also be a behavior, like gambling or shopping. The need for the drug or activity can become so strong that you think about it all the time. You can also become physically dependent on a substance. Addiction can change the way your brain works. Because of these changes, getting more of whatever you are addicted to becomes the most important thing to you and feels better than other activities or relationships. Addiction can lead to changes in health, behavior, emotions, relationships, and choices that affect you and everyone around you. How do I know if I need treatment for addiction? Addiction is a progressive disease. Without treatment, addiction can get worse. Living with addiction puts you at higher  risk for injury, poor health, lost employment, loss of money, and even death. You might need treatment for addiction if:  You have tried to stop or cut down, but you cannot.  Your addiction is causing physical health problems.  You find it annoying that your friends and family are concerned about your alcohol or substance use.  You feel guilty about substance abuse or a compulsive behavior.  You have lied or tried to hide your addiction.  You need a particular substance or activity to start your day or to calm down.  You are getting in trouble at school, work, home, or with the police.  You  have done something illegal to support your addiction.  You are running out of money because of your addiction.  You have no time for anything other than your addiction.  What types of treatment are available? The treatment program that is right for you will depend on many factors, including the type of addiction you have. Treatment programs can be outpatient or inpatient. In an outpatient program, you live at home and go to work or school, but you also go to a clinic for treatment. With an inpatient program, you live and sleep at the program facility during treatment. After treatment, you might need a plan for support during recovery. Other treatment options include:  Medicine. ? Some addictions may be treated with prescription medicines. ? You might also need medicine to treat anxiety or depression.  Counseling and behavior therapy. Therapy can help individuals and families behave in healthier ways and relate more effectively.  Support groups. Confidential group therapy, such as a 12-step program, can help individuals and families during treatment and recovery.  No single type of program is right for everyone. Many treatment programs involve a combination of education, counseling, and a 12-step, spiritually-based approach. Some treatment programs are government sponsored. They are geared for patients who do not have private insurance. Treatment programs can vary in many respects, such as:  Cost and types of insurance that are accepted.  Types of on-site medical services that are offered.  Length of stay, setting, and size.  Overall philosophy of treatment.  What should I consider when selecting a treatment program? It is important to think about your individual requirements when selecting a treatment program. There are a number of things to consider, such as:  If the program is certified by the appropriate government agency. Even private programs must be certified and employ certified  professionals.  If the program is covered by your insurance. If finances are a concern, the first call you should make is to your insurance company, if you have health insurance. Ask for a list of treatment programs that are in your network, and confirm any copayments and deductibles that you may have to pay. ? If you do not have insurance, or if you choose to attend a program that does not accept your insurance, discuss whether a payment plan can be set up.  If treatment is available in languages other than English, if needed.  If the program offers detoxification treatment, if needed.  If 12-step meetings are held at the center or if transport is available for patients to attend meetings at other locations.  If the program is professional, organized, and clean.  If the program meets all of your needs, including physical and cultural needs.  If the facility offers specific treatment for your particular addiction.  If support continues to be offered after you have left the program.  If your treatment plan is continually looked at to make sure you are receiving the right treatment at the right time.  If mental health counseling is part of your treatment.  If medicine is included in treatment, if needed.  If your family is included in your treatment plan and if support is offered to them throughout the treatment process.  How the treatment works to prevent relapse.  Where else can I get help?  Your health care provider. Ask him or her to help you find addiction treatment. These discussions are confidential.  The [employer] on Alcoholism and Drug Dependence (NCADD). This group has information about treatment centers and programs for people who have an addiction and for family members. ? The telephone number is 1-800-NCA-CALL (351-293-8086). ? The website is https://ncadd.org/about-ncadd/our-affiliates  The Substance Abuse and Mental Health Services Administration [facility]).  This group will help you find publicly funded treatment centers, help hotlines, and counseling services near you. ? The telephone number is 1-800-662-HELP (669-031-6365). ? The website is www.findtreatment.RockToxic.pl In countries outside of the [place]. and [place], look in [employer] for contact information for services in your area. This information is not intended to replace advice given to you by your health care provider. Make sure you discuss any questions you have with your health care provider. Document Released: 01/10/2005 Document Revised: 01/08/2016 Document Reviewed: 11/30/2013 Elsevier Interactive Patient Education  2017 [employer].

## 2017-11-19 NOTE — Progress Notes (Deleted)
Patient ID: Laura English, female   DOB: 1980/09/18, 37 y.o.   MRN: [id number]  After being seen in the ED for depression, STD check, and drug use.  She tested positive for THC and amphetamine.  She was evaluated by [facility].  Found not to be of immediate threat to herself or others.  Previously on Lexapro which helped with anxiety-it is unclear if this was re-started per the notes I reviewed.  Drug use cessation was recommended. STD testing was negative.

## 2017-11-19 NOTE — Progress Notes (Signed)
Patient ID: Laura English, female    DOB: 03/16/1980, 37 y.o.   MRN: [id number]  PCP: Patient, No Pcp Per  Chief Complaint  Patient presents with  . Hospitalization Follow-up    seen at the ED in August for substance abuse & depression. 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 is does use marijuana to help with sleep & appetite. is having really bad mood swings    Subjective:  HPI Laura English is a 37 y.o. female presents for ED follow-up. Recently seen in ED for depression and tested positive for [facility] and methamphetamine. 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 Since attempting to independently detox, she has experienced sensation of "skin-crawling", "mood swings", and anxiousness. She doesn't want to attend detox or drug rehab.  She feels that she can kick the habit on her own.  She reports she is only use drug for 3 months and to begin using drugs with her significant other.  She has been independently attending routine substance abuse meetings which she feels has helped.  She complains today of just feeling poorly emotionally and feels very angry as she is unable to rest appropriately.  She denies any homicidal suicidal ideations or auditory hallucinations.  She reports poor eating however she is being marijuana to increase her appetite and she reports that this also facilitate sleep.  Patient has a history of depression and anxiety and was previously prescribed Lexapro which she reports she took consistently for about 4 years and she would like to resume medication today.  She reports that she has had bad reactions with Zoloft and Paxil in the past.  She has never been prescribed medication for mood disorder and worries that she may have bipolar due to information she has researched.  Social History   Socioeconomic History  . Marital status: Single    Spouse name: Not on file  . Number of children: Not on file  . Years  of education: Not on file  . Highest education level: Not on file  Occupational History  . Not on file  Social Needs  . Financial resource strain: Not on file  . Food insecurity:    Worry: Not on file    Inability: Not on file  . Transportation needs:    Medical: Not on file    Non-medical: Not on file  Tobacco Use  . Smoking status: Current Every Day Smoker    Packs/day: 0.50    Years: 10.00    Pack years: 5.00    Types: Cigarettes  . Smokeless tobacco: Never Used  . Tobacco comment: e- cig  Substance and Sexual Activity  . Alcohol use: No    Comment: none with pregnancy  . 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  . Sexual activity: Yes    Birth control/protection: None  Lifestyle  . Physical activity:    Days per week: Not on file    Minutes per session: Not on file  . Stress: Not on file  Relationships  . Social connections:    Talks on phone: Not on file    Gets together: Not on file    Attends religious service: Not on file    Active member of club or organization: Not on file    Attends meetings of clubs or organizations: Not on file    Relationship status: Not on file  .  Intimate partner violence:    Fear of current or ex partner: Not on file    Emotionally abused: Not on file    Physically abused: Not on file    Forced sexual activity: Not on file  Other Topics Concern  . Not on file  Social History Narrative  . Not on file    Family History  Problem Relation Age of Onset  . Emphysema Mother   . Dementia Maternal Grandfather   . Alcohol abuse Father   . Mental illness Father   . Mental illness Brother   . Diabetes Maternal Grandmother    Review of Systems    Patient Active Problem List   Diagnosis Date Noted  . Substance induced mood disorder (HCC) 10/22/2017  . Carpal tunnel syndrome 06/04/2013  . Insomnia 01/04/2013  . Routine general medical examination at a health care facility 12/07/2012  . GAD (generalized  anxiety disorder) 12/07/2012  . Hip pain 12/07/2012    No Known Allergies  Prior to Admission medications   Not on File    Past Medical, Surgical Family and Social History reviewed and updated.    Objective:   Today's Vitals   11/19/17 1102  BP: 93/63  Pulse: 83  Resp: 17  Temp: (!) 97.5 F (36.4 C)  TempSrc: Oral  SpO2: 100%  Weight: 110 lb 9.6 oz (50.2 kg)    Wt Readings from Last 3 Encounters:  11/19/17 110 lb 9.6 oz (50.2 kg)  10/04/15 120 lb (54.4 kg)  06/04/13 118 lb (53.5 kg)   Physical Exam  Constitutional: Patient appears well-developed, well-groomed and well-nourished. No distress. HENT: Normocephalic, atraumatic, External right and left ear normal. Oropharynx is clear and moist.  Eyes: Conjunctivae and EOM are normal. PERRLA, no scleral icterus. Neck: Normal ROM. Neck supple. No JVD. No tracheal deviation. No thyromegaly. CVS: RRR, S1/S2 +, no murmurs, no gallops, no carotid bruit.  Pulmonary: Effort and breath sounds normal, no stridor, rhonchi, wheezes, rales.  Abdominal: Soft. BS +, no distension, tenderness, rebound or guarding.  Musculoskeletal: Normal range of motion. No edema and no tenderness.  Neuro: Alert. Normal reflexes, muscle tone coordination. No cranial nerve deficit. Skin: Skin is warm and dry. No rash noted. Not diaphoretic. No erythema. No pallor. Psychiatric: Anxious mood, rapid speech. Appropriate thought content normal.  Assessment & Plan:  1. Depression, unspecified depression type 2. History of substance abuse 3. GAD (generalized anxiety disorder) Patient presents today with symptoms of depressed mood with anxiousness. Recently discontinued use of daily methamphetamines and [facility] been clean for 4 weeks. Currently opposed to substance abuse rehabilitation however is open to seeking counseling at [facility]. We agreed to start escitalopram 10 mg  today and have her increase to 20 mg in 1 week. For anxiety, will also  add Buspirone 5 mg, 3 times daily. She will return in 4 weeks for follow-up of medication effectiveness and in the meantime, plans to reach out to [facility].  4. Urine frequency, urine dipstick negative today of bacteria. Encouraged hydration with water and limiting caffeine.  A total of 25 minutes spent, greater than 50 % of this time was spent counseling and coordination of care.   Return in about 4 weeks (around 12/17/2017) for depression and mood swings .  Godfrey Pick. Tiburcio Pea, MSN, [facility]  [address] [place], [place] [address] [phone]  If symptoms worsen or do not improve, return for follow-up, or see care at the emergency department if severity of  symptoms warrant a higher level of care.

## 2017-12-11 ENCOUNTER — Encounter: Payer: Self-pay | Admitting: Family Medicine

## 2017-12-11 ENCOUNTER — Other Ambulatory Visit: Payer: Self-pay

## 2017-12-11 ENCOUNTER — Ambulatory Visit: Payer: Self-pay | Attending: Family Medicine | Admitting: Family Medicine

## 2017-12-11 VITALS — BP 105/72 | HR 79 | Temp 98.2°F | Resp 18 | Ht 61.0 in | Wt 117.2 lb

## 2017-12-11 DIAGNOSIS — F1911 Other psychoactive substance abuse, in remission: Secondary | ICD-10-CM

## 2017-12-11 DIAGNOSIS — F411 Generalized anxiety disorder: Secondary | ICD-10-CM

## 2017-12-11 DIAGNOSIS — F1910 Other psychoactive substance abuse, uncomplicated: Secondary | ICD-10-CM | POA: Insufficient documentation

## 2017-12-11 DIAGNOSIS — Z23 Encounter for immunization: Secondary | ICD-10-CM

## 2017-12-11 DIAGNOSIS — F32A Depression, unspecified: Secondary | ICD-10-CM

## 2017-12-11 DIAGNOSIS — F329 Major depressive disorder, single episode, unspecified: Secondary | ICD-10-CM

## 2017-12-11 MED ORDER — ESCITALOPRAM OXALATE 10 MG PO TABS: 10.0000 mg | ORAL_TABLET | Freq: Every day | ORAL | 1 refills | Status: DC

## 2017-12-11 MED ORDER — BUSPIRONE HCL 10 MG PO TABS: 10.0000 mg | ORAL_TABLET | Freq: Three times a day (TID) | ORAL | 1 refills | Status: AC

## 2017-12-11 MED FILL — ESCITALOPRAM 10 MG TABLET: 10 | 30 days supply | Qty: 30 | Fill #0

## 2017-12-11 MED FILL — busPIRone HCL 10 MG TABS: 10 | 30 days supply | Qty: 90 | Fill #0
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
## 2017-12-11 NOTE — Progress Notes (Signed)
Pain: no Flu shot; yes  Wanted a physical next appointment   lexapro is not working like before, doent know if that is what will work for her right now.   Refill on meds

## 2018-01-01 ENCOUNTER — Ambulatory Visit: Payer: Self-pay | Attending: Family Medicine | Admitting: Family Medicine

## 2018-01-01 ENCOUNTER — Encounter: Payer: Self-pay | Admitting: Family Medicine

## 2018-01-01 VITALS — BP 100/67 | HR 86 | Temp 98.6°F | Ht 61.0 in | Wt 114.2 lb

## 2018-01-01 DIAGNOSIS — N644 Mastodynia: Secondary | ICD-10-CM

## 2018-01-01 DIAGNOSIS — Z124 Encounter for screening for malignant neoplasm of cervix: Secondary | ICD-10-CM

## 2018-01-01 DIAGNOSIS — N898 Other specified noninflammatory disorders of vagina: Secondary | ICD-10-CM

## 2018-01-01 DIAGNOSIS — Z0001 Encounter for general adult medical examination with abnormal findings: Secondary | ICD-10-CM

## 2018-01-01 DIAGNOSIS — Z1272 Encounter for screening for malignant neoplasm of vagina: Secondary | ICD-10-CM | POA: Insufficient documentation

## 2018-01-01 DIAGNOSIS — Z01419 Encounter for gynecological examination (general) (routine) without abnormal findings: Secondary | ICD-10-CM | POA: Insufficient documentation

## 2018-01-01 MED ORDER — CEPHALEXIN 500 MG PO CAPS: 500.0000 mg | ORAL_CAPSULE | Freq: Two times a day (BID) | ORAL | 0 refills | Status: AC

## 2018-01-01 NOTE — Patient Instructions (Signed)

## 2018-01-01 NOTE — Progress Notes (Signed)
Subjective:    Patient ID: Laura English, female    DOB: 11-02-1980, 37 y.o.   MRN: [id number]  HPI 37 year old female who presents for annual well exam.  Patient reports last Pap was a few years ago and was normal.  Patient reports that she is having some left breast tenderness.  Patient reports that she also has a history of recurrent sebaceous cyst on the right breast but no current enlargement of the cyst.  Patient reports her last menstrual period was approximately 2 weeks ago.  Patient is having some vaginal discharge      On a personal note, patient reports that she and her boyfriend are no longer together as he is finally moved out.  Patient reports that she has continued to be abstinent from use of methamphetamines.  Patient was also abstinent from alcohol use.  Patient states that she has an appointment coming up at [facility] mental health for her depression. Past Medical History:  Diagnosis Date  . Depression    hx depression - no meds - no current problems  . Heartburn in pregnancy    on pepcid  . No pertinent past medical history    Past Surgical History:  Procedure Laterality Date  . CESAREAN SECTION     3x  . TUBAL LIGATION     Family History  Problem Relation Age of Onset  . Emphysema Mother   . Dementia Maternal Grandfather   . Alcohol abuse Father   . Mental illness Father   . Mental illness Brother   . Diabetes Maternal Grandmother    Social History   Tobacco Use  . Smoking status: Current Every Day Smoker    Packs/day: 1.00    Years: 10.00    Pack years: 10.00    Types: Cigarettes  . Smokeless tobacco: Never Used  . Tobacco comment: e- cig  Substance Use Topics  . Alcohol use: No  . 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  No Known Allergies    Review of Systems  Constitutional: Positive for fatigue. Negative for chills and fever.  HENT: Negative for dental problem and trouble swallowing.   Eyes: Negative for  photophobia and visual disturbance.  Respiratory: Negative for cough and shortness of breath.   Gastrointestinal: Negative for abdominal pain and nausea.  Endocrine: Negative for polydipsia, polyphagia and polyuria.  Genitourinary: Positive for vaginal discharge. Negative for dysuria, flank pain, frequency, genital sores, menstrual problem, pelvic pain and vaginal pain.  Musculoskeletal: Negative for arthralgias, back pain and joint swelling.  Neurological: Negative for dizziness and headaches.  Psychiatric/Behavioral: Positive for sleep disturbance. Negative for self-injury and suicidal ideas. The patient is nervous/anxious.        Objective:   Physical Exam BP 100/67 (BP Location: Left Arm, Patient Position: Sitting, Cuff Size: Normal)   Pulse 86   Temp 98.6 F (37 C) (Oral)   Ht 5\' 1"  (1.549 m)   Wt 114 lb 3.2 oz (51.8 kg)   LMP 12/15/2017   SpO2 96%   BMI 21.58 kg/m Vital signs and nurse's notes reviewed General-well-nourished, well-developed female in no acute distress Lungs-clear to auscultation bilaterally Cardiovascular-regular rate and rhythm Breast exam-no axillary adenopathy, on the right breast, patient has some grouped blackheads/sebaceous glands that are not enlarged and are grouped on the right upper outer areola at approximately 10 to 12 o'clock position.  Right breast is nontender.  Patient does not have any left  axillary adenopathy.  Patient does have a small abraded area on the left breast below the areola and patient does have palpable area of tenderness in the left upper breast.  No nipple discharge or nipple changes bilaterally Abdomen-soft, nontender Back-no CVA tenderness GU- normal external genitalia, normal appearance of the vaginal canal but there is a presence of some mild erythema within the canal.  Patient has a yellowish-white vaginal discharge in the posterior vaginal canal.  Normal appearance of the cervix with the exception of some mild erythema.  Patient  does not have any cervical motion tenderness or adnexal tenderness on exam. Extremities-no edema Psych-normal mood and judgment      Assessment & Plan:  1. Annual visit for general adult medical examination with abnormal findings Patient had annual well exam however she did have some abnormal findings of left breast tenderness as well as vaginal discharge.  Patient will be scheduled for left breast ultrasound and Pap smear was done today with cytology.  Patient will also return for fasting CMP and fasting lipid panel.  Handout given on well female health maintenance. - Comprehensive metabolic panel; Future - Lipid panel; Future  2. Breast pain, left Patient with complaint of some left breast pain.  Patient will be scheduled for ultrasound of the left breast.  Discussed with patient that this may represent a reactive change/inflammation or an enlarged lymph node secondary to the abraded area on the left breast and patient is being placed on Keflex - US BREAST LTD UNI LEFT INC AXILLA; Future - cephALEXin (KEFLEX) 500 MG capsule; Take 1 capsule (500 mg total) by mouth 2 (two) times daily for 10 days.  Dispense: 20 capsule; Refill: 0  3. Screening for cervical cancer Patient had Pap smear done at today's visit as a screening test for cervical cancer.  On review of chart, patient had Pap smear done in 2014 which was normal.  Patient will be notified of her results and when her next Pap smear will be due based on these results. - Cytology - PAP  4. Vaginal discharge Patient with vaginal discharge on exam and patient will also have ancillary testing for STIs and patient has had recent blood test for HIV and syphilis. - Cytology - PAP  An After Visit Summary was printed and given to the patient.  Return in about 4 weeks (around 01/29/2018) for breast pain.

## 2018-01-05 ENCOUNTER — Other Ambulatory Visit: Payer: Self-pay | Admitting: Family Medicine

## 2018-01-05 DIAGNOSIS — N644 Mastodynia: Secondary | ICD-10-CM

## 2018-01-05 LAB — CYTOLOGY - PAP
Adequacy: ABSENT
Bacterial vaginitis: POSITIVE — AB
Candida vaginitis: NEGATIVE
Chlamydia: NEGATIVE
Diagnosis: NEGATIVE
HPV: NOT DETECTED
Neisseria Gonorrhea: NEGATIVE
Trichomonas: NEGATIVE

## 2018-01-06 ENCOUNTER — Other Ambulatory Visit: Payer: Self-pay | Admitting: Family Medicine

## 2018-01-06 DIAGNOSIS — B9689 Other specified bacterial agents as the cause of diseases classified elsewhere: Secondary | ICD-10-CM

## 2018-01-06 DIAGNOSIS — N760 Acute vaginitis: Principal | ICD-10-CM

## 2018-01-06 MED ORDER — METRONIDAZOLE 500 MG PO TABS: 500.0000 mg | ORAL_TABLET | Freq: Two times a day (BID) | ORAL | 0 refills | Status: DC

## 2018-01-06 NOTE — Progress Notes (Unsigned)
Patient ID: Laura English, female   DOB: 1981/02/09, 37 y.o.   MRN: [id number]   Patient with evidence of an overgrowth of bacteria on the cytology done when she had a pap smear. The bacteria were the kind the cause bacterial vaginosis-BV. A prescription will be sent in her pharmacy for metronidazole for treatment. See result note.

## 2018-01-08 ENCOUNTER — Ambulatory Visit: Payer: Self-pay | Attending: Family Medicine

## 2018-01-08 ENCOUNTER — Ambulatory Visit: Payer: Self-pay | Admitting: Family Medicine

## 2018-01-08 DIAGNOSIS — Z0001 Encounter for general adult medical examination with abnormal findings: Secondary | ICD-10-CM

## 2018-01-09 ENCOUNTER — Telehealth (INDEPENDENT_AMBULATORY_CARE_PROVIDER_SITE_OTHER): Payer: Self-pay

## 2018-01-09 LAB — COMPREHENSIVE METABOLIC PANEL WITH GFR
ALT: 10 IU/L (ref 0–32)
AST: 13 IU/L (ref 0–40)
Albumin/Globulin Ratio: 1.8 (ref 1.2–2.2)
Albumin: 4.2 g/dL (ref 3.5–5.5)
Alkaline Phosphatase: 74 IU/L (ref 39–117)
BUN/Creatinine Ratio: 15 (ref 9–23)
BUN: 9 mg/dL (ref 6–20)
Bilirubin Total: 0.3 mg/dL (ref 0.0–1.2)
CO2: 20 mmol/L (ref 20–29)
Calcium: 9.1 mg/dL (ref 8.7–10.2)
Chloride: 105 mmol/L (ref 96–106)
Creatinine, Ser: 0.61 mg/dL (ref 0.57–1.00)
GFR calc Af Amer: 135 mL/min/1.73
GFR calc non Af Amer: 117 mL/min/1.73
Globulin, Total: 2.3 g/dL (ref 1.5–4.5)
Glucose: 84 mg/dL (ref 65–99)
Potassium: 4.7 mmol/L (ref 3.5–5.2)
Sodium: 139 mmol/L (ref 134–144)
Total Protein: 6.5 g/dL (ref 6.0–8.5)

## 2018-01-09 LAB — LIPID PANEL
Chol/HDL Ratio: 3.7 ratio (ref 0.0–4.4)
Cholesterol, Total: 165 mg/dL (ref 100–199)
HDL: 45 mg/dL
LDL Calculated: 98 mg/dL (ref 0–99)
Triglycerides: 110 mg/dL (ref 0–149)
VLDL Cholesterol Cal: 22 mg/dL (ref 5–40)

## 2018-01-09 NOTE — Telephone Encounter (Signed)
-----   Message from Cain Saupeammie Fulp, MD sent at 01/09/2018 11:01 AM EST ----- Please notify patient of normal CMP and normal lipid panel

## 2018-01-09 NOTE — Telephone Encounter (Signed)
Patient is aware that CMP and lipid panel were normal. Also notified patient that pap was normal except for positive BV. Informed patient that metronidazole has been sent to her pharmacy for treatment. Patient expressed understanding. Laura English Rodger Giangregorio, CMA

## 2018-01-15 MED FILL — ARIPiprazole 10 MG TABS: 10 | 30 days supply | Qty: 30 | Fill #0

## 2018-01-15 MED FILL — metroNIDAZOLE 500 MG TABS: 500 | 7 days supply | Qty: 14 | Fill #0

## 2018-01-15 MED FILL — ESCITALOPRAM 10 MG TABLET: 10 | 30 days supply | Qty: 30 | Fill #1

## 2018-01-29 ENCOUNTER — Ambulatory Visit: Payer: Self-pay | Admitting: Family Medicine

## 2018-04-01 ENCOUNTER — Ambulatory Visit: Payer: Self-pay | Admitting: Family Medicine

## 2018-06-22 MED FILL — SUBVENITE 100 MG TABS: 100 | 30 days supply | Qty: 30 | Fill #0

## 2018-06-22 MED FILL — hydrOXYzine HCL 25 MG TABS: 25 | 30 days supply | Qty: 90 | Fill #0

## 2018-06-22 MED FILL — ESCITALOPRAM 10 MG TABLET: 10 | 30 days supply | Qty: 30 | Fill #0

## 2018-06-22 MED FILL — ARIPiprazole 10 MG TABS: 10 | 30 days supply | Qty: 30 | Fill #0

## 2018-07-27 MED FILL — ARIPiprazole 10 MG TABS: 10 | 30 days supply | Qty: 30 | Fill #1

## 2018-07-27 MED FILL — ESCITALOPRAM 10 MG TABLET: 10 | 30 days supply | Qty: 30 | Fill #1

## 2018-08-18 ENCOUNTER — Telehealth: Payer: Self-pay | Admitting: Primary Care

## 2018-12-08 ENCOUNTER — Other Ambulatory Visit: Payer: Self-pay

## 2018-12-08 DIAGNOSIS — Z20822 Contact with and (suspected) exposure to covid-19: Secondary | ICD-10-CM

## 2018-12-09 ENCOUNTER — Telehealth: Payer: Self-pay

## 2018-12-09 LAB — NOVEL CORONAVIRUS, NAA: SARS-CoV-2, NAA: NOT DETECTED

## 2018-12-09 NOTE — Telephone Encounter (Signed)
Patient called in requesting [place] lab results after checking in MyChart - DOB/Address verified - results pending. Reviewed testing process, no further questions.

## 2019-09-28 ENCOUNTER — Other Ambulatory Visit: Payer: Self-pay

## 2019-09-28 ENCOUNTER — Encounter (HOSPITAL_COMMUNITY): Payer: Self-pay | Admitting: Emergency Medicine

## 2019-09-28 ENCOUNTER — Emergency Department (HOSPITAL_COMMUNITY)
Admission: EM | Admit: 2019-09-28 | Discharge: 2019-09-28 | Disposition: A | Discharge status: Home / Self Care | Payer: Self-pay | Attending: Emergency Medicine | Admitting: Emergency Medicine

## 2019-09-28 DIAGNOSIS — Z5321 Procedure and treatment not carried out due to patient leaving prior to being seen by health care provider: Secondary | ICD-10-CM | POA: Insufficient documentation

## 2019-09-28 DIAGNOSIS — M79675 Pain in left toe(s): Secondary | ICD-10-CM | POA: Insufficient documentation

## 2019-09-28 NOTE — ED Triage Notes (Signed)
Left toe pain on pinky Patient hit bottom of box spring Hurts to walk in closed toe shoe Patient tried to  Rest off of foot for a couple days, but when she tried to go to work the pain came back.

## 2019-09-28 NOTE — ED Notes (Signed)
I called patient for a fast track room and no one responded °

## 2019-10-03 ENCOUNTER — Emergency Department (HOSPITAL_COMMUNITY): Payer: Self-pay

## 2019-10-03 ENCOUNTER — Encounter (HOSPITAL_COMMUNITY): Payer: Self-pay

## 2019-10-03 DIAGNOSIS — S92515A Nondisplaced fracture of proximal phalanx of left lesser toe(s), initial encounter for closed fracture: Secondary | ICD-10-CM | POA: Insufficient documentation

## 2019-10-03 DIAGNOSIS — W228XXA Striking against or struck by other objects, initial encounter: Secondary | ICD-10-CM | POA: Insufficient documentation

## 2019-10-03 DIAGNOSIS — F17210 Nicotine dependence, cigarettes, uncomplicated: Secondary | ICD-10-CM | POA: Insufficient documentation

## 2019-10-03 DIAGNOSIS — Y9289 Other specified places as the place of occurrence of the external cause: Secondary | ICD-10-CM | POA: Insufficient documentation

## 2019-10-03 DIAGNOSIS — Y9389 Activity, other specified: Secondary | ICD-10-CM | POA: Insufficient documentation

## 2019-10-03 DIAGNOSIS — Y998 Other external cause status: Secondary | ICD-10-CM | POA: Insufficient documentation

## 2019-10-03 NOTE — ED Triage Notes (Signed)
Reports continued L pinky toe pain from an injury a week ago. Reports that she injured it on the bottom of a mattress. 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 A&Ox4.

## 2019-10-04 ENCOUNTER — Emergency Department (HOSPITAL_COMMUNITY)
Admission: EM | Admit: 2019-10-04 | Discharge: 2019-10-04 | Disposition: A | Discharge status: Home / Self Care | Payer: Self-pay | Attending: Emergency Medicine | Admitting: Emergency Medicine

## 2019-10-04 ENCOUNTER — Other Ambulatory Visit: Payer: Self-pay

## 2019-10-04 DIAGNOSIS — S92515A Nondisplaced fracture of proximal phalanx of left lesser toe(s), initial encounter for closed fracture: Secondary | ICD-10-CM

## 2019-10-04 NOTE — ED Provider Notes (Signed)
[facility]-EMERGENCY DEPT Provider Note   CSN: [id number] Arrival date & time: 10/03/19  2241     History Chief Complaint  Patient presents with  . Toe Pain    Laura English is a 39 y.o. female.   Toe Pain This is a new problem. Episode onset: 1 week ago. The problem occurs constantly. The problem has not changed since onset.The symptoms are aggravated by walking. Nothing relieves the symptoms. Treatments tried: buddy taping  The treatment provided no relief.       Past Medical History:  Diagnosis Date  . Depression    hx depression - no meds - no current problems  . Heartburn in pregnancy    on pepcid  . No pertinent past medical history     Patient Active Problem List   Diagnosis Date Noted  . Substance induced mood disorder (HCC) 10/22/2017  . Carpal tunnel syndrome 06/04/2013  . Insomnia 01/04/2013  . Routine general medical examination at a health care facility 12/07/2012  . GAD (generalized anxiety disorder) 12/07/2012  . Hip pain 12/07/2012    Past Surgical History:  Procedure Laterality Date  . CESAREAN SECTION     3x  . TUBAL LIGATION       OB History    Gravida  3   Para  3   Term  3   Preterm      AB      Living  3     SAB      TAB      Ectopic      Multiple      Live Births  1           Family History  Problem Relation Age of Onset  . Emphysema Mother   . Dementia Maternal Grandfather   . Alcohol abuse Father   . Mental illness Father   . Mental illness Brother   . Diabetes Maternal Grandmother     Social History   Tobacco Use  . Smoking status: Current Every Day Smoker    Packs/day: 1.00    Years: 10.00    Pack years: 10.00    Types: Cigarettes  . Smokeless tobacco: Never Used  . Tobacco comment: e- cig  Substance Use Topics  . Alcohol use: No  . 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    Home Medications Prior to Admission medications     Medication Sig Start Date End Date Taking? Authorizing Provider  escitalopram (LEXAPRO) 10 MG tablet Take 1 tablet (10 mg total) by mouth at bedtime. . 12/11/17   Fulp, Cammie, MD  metroNIDAZOLE (FLAGYL) 500 MG tablet Take 1 tablet (500 mg total) by mouth 2 (two) times daily. 01/06/18   Cain Saupe, MD    Allergies    Patient has no known allergies.  Review of Systems   Review of Systems  Ten systems reviewed and are negative for acute change, except as noted in the HPI.    Physical Exam Updated Vital Signs BP 126/83 (BP Location: Left Arm)   Pulse 72   Temp 98.3 F (36.8 C) (Oral)   Resp 12   Ht 5\' 1"  (1.549 m)   Wt 45.6 kg   LMP 10/03/2019   SpO2 98%   BMI 18.99 kg/m   Physical Exam Vitals and nursing note reviewed.  Constitutional:      General: She is not in acute distress.  Appearance: She is well-developed. She is not diaphoretic.     Comments: Nontoxic appearing  HENT:     Head: Normocephalic and atraumatic.  Eyes:     General: No scleral icterus.    Conjunctiva/sclera: Conjunctivae normal.  Cardiovascular:     Rate and Rhythm: Normal rate and regular rhythm.     Pulses: Normal pulses.     Comments: DP pulse 2+ in the LLE Pulmonary:     Effort: Pulmonary effort is normal. No respiratory distress.  Musculoskeletal:        General: Normal range of motion.     Cervical back: Normal range of motion.     Comments: Mild swelling to left 5th toe with TTP to the proximal digit.  Skin:    General: Skin is warm and dry.     Coloration: Skin is not pale.     Findings: No erythema or rash.  Neurological:     Mental Status: She is alert and oriented to person, place, and time.  Psychiatric:        Behavior: Behavior normal.     ED Results / Procedures / Treatments   Labs (all labs ordered are listed, but only abnormal results are displayed) Labs Reviewed - No data to display  EKG None  Radiology DG Foot Complete Left  Result Date:  10/03/2019 CLINICAL DATA:  Hit little toe, pain EXAM: LEFT FOOT - COMPLETE 3+ VIEW COMPARISON:  None. FINDINGS: There is an oblique fracture noted through the proximal phalanx of the left little toe. This is nondisplaced. No subluxation or dislocation. No additional acute bony abnormality. IMPRESSION: Nondisplaced oblique fracture through the proximal phalanx of the left 5th toe. Electronically Signed   By: Charlett Nose M.D.   On: 10/03/2019 23:25    Procedures Procedures (including critical care time)  Medications Ordered in ED Medications - No data to display  ED Course  I have reviewed the triage vital signs and the nursing notes.  Pertinent labs & imaging results that were available during my care of the patient were reviewed by me and considered in my medical decision making (see chart for details).    MDM Rules/Calculators/A&P                          39 year old female presenting for pain to her left fifth toe.  Found to have nondisplaced oblique fracture through the proximal phalanx.  Neurovascularly intact.  Encouraged continued supportive management, buddy taping.  Stable for follow-up with her primary care doctor.  Discharged in stable condition.   Final Clinical Impression(s) / ED Diagnoses Final diagnoses:  Closed nondisplaced fracture of proximal phalanx of lesser toe of left foot, initial encounter    Rx / DC Orders ED Discharge Orders    None       Antony Madura, PA-C 10/04/19 8502    Marily Memos, MD 10/04/19 [phone]

## 2020-01-12 ENCOUNTER — Emergency Department (HOSPITAL_COMMUNITY)
Admission: EM | Admit: 2020-01-12 | Discharge: 2020-01-12 | Disposition: A | Discharge status: Home / Self Care | Payer: Self-pay | Attending: Emergency Medicine | Admitting: Emergency Medicine

## 2020-01-12 ENCOUNTER — Emergency Department (HOSPITAL_COMMUNITY): Payer: Self-pay

## 2020-01-12 ENCOUNTER — Other Ambulatory Visit: Payer: Self-pay

## 2020-01-12 ENCOUNTER — Encounter (HOSPITAL_COMMUNITY): Payer: Self-pay

## 2020-01-12 ENCOUNTER — Other Ambulatory Visit (HOSPITAL_COMMUNITY): Payer: Self-pay | Admitting: Emergency Medicine

## 2020-01-12 DIAGNOSIS — T148XXA Other injury of unspecified body region, initial encounter: Secondary | ICD-10-CM

## 2020-01-12 DIAGNOSIS — Z79899 Other long term (current) drug therapy: Secondary | ICD-10-CM | POA: Insufficient documentation

## 2020-01-12 DIAGNOSIS — F17210 Nicotine dependence, cigarettes, uncomplicated: Secondary | ICD-10-CM | POA: Insufficient documentation

## 2020-01-12 DIAGNOSIS — X500XXA Overexertion from strenuous movement or load, initial encounter: Secondary | ICD-10-CM | POA: Insufficient documentation

## 2020-01-12 DIAGNOSIS — M546 Pain in thoracic spine: Secondary | ICD-10-CM | POA: Insufficient documentation

## 2020-01-12 MED ORDER — IBUPROFEN 600 MG PO TABS: 600.0000 mg | ORAL_TABLET | Freq: Four times a day (QID) | ORAL | 0 refills | Status: DC | PRN

## 2020-01-12 MED ORDER — METHOCARBAMOL 750 MG PO TABS: 750.0000 mg | ORAL_TABLET | Freq: Two times a day (BID) | ORAL | 0 refills | Status: DC

## 2020-01-12 MED ORDER — IBUPROFEN 200 MG PO TABS
600.0000 mg | ORAL_TABLET | Freq: Once | ORAL | Status: AC
  Administered 2020-01-12: 600 mg via ORAL
  Filled 2020-01-12: qty 3

## 2020-01-12 MED FILL — METHOCARBAMOL 750 MG TABS: 750 | 7 days supply | Qty: 14 | Fill #0

## 2020-01-12 MED FILL — IBUPROFEN 600 MG TABLET: 600 | 7 days supply | Qty: 30 | Fill #0
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
## 2020-01-12 NOTE — Discharge Instructions (Signed)
Follow up with your doctor for recheck if symptoms persist. Return to the emergency department with any new or worsening symptoms.

## 2020-01-12 NOTE — ED Provider Notes (Signed)
[facility]-EMERGENCY DEPT Provider Note   CSN: [id number] Arrival date & time: 01/12/20  0139     History Chief Complaint  Patient presents with  . Back Pain    Laura English is a 39 y.o. female.  Patient to ED with c/o pain in her upper back extending to chest that started 3 weeks ago after heavy lifting. She reports limited movement especially over the last several days. No SOB, cough, fever, however, the pain is worse with deep breathing and certain positions. No nausea or vomiting.   The history is provided by the patient. No language interpreter was used.  Back Pain Associated symptoms: no fever        Past Medical History:  Diagnosis Date  . Depression    hx depression - no meds - no current problems  . Heartburn in pregnancy    on pepcid  . No pertinent past medical history     Patient Active Problem List   Diagnosis Date Noted  . Substance induced mood disorder (HCC) 10/22/2017  . Carpal tunnel syndrome 06/04/2013  . Insomnia 01/04/2013  . Routine general medical examination at a health care facility 12/07/2012  . GAD (generalized anxiety disorder) 12/07/2012  . Hip pain 12/07/2012    Past Surgical History:  Procedure Laterality Date  . CESAREAN SECTION     3x  . TUBAL LIGATION       OB History    Gravida  3   Para  3   Term  3   Preterm      AB      Living  3     SAB      TAB      Ectopic      Multiple      Live Births  1           Family History  Problem Relation Age of Onset  . Emphysema Mother   . Dementia Maternal Grandfather   . Alcohol abuse Father   . Mental illness Father   . Mental illness Brother   . Diabetes Maternal Grandmother     Social History   Tobacco Use  . Smoking status: Current Every Day Smoker    Packs/day: 1.00    Years: 10.00    Pack years: 10.00    Types: Cigarettes  . Smokeless tobacco: Never Used  . Tobacco comment: e- cig  Substance Use Topics  .  Alcohol use: No  . 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 Authorizing Provider  escitalopram (LEXAPRO) 10 MG tablet Take 1 tablet (10 mg total) by mouth at bedtime. . 12/11/17   Fulp, Cammie, MD  metroNIDAZOLE (FLAGYL) 500 MG tablet Take 1 tablet (500 mg total) by mouth 2 (two) times daily. 01/06/18   Cain Saupe, MD    Allergies    Patient has no known allergies.  Review of Systems   Review of Systems  Constitutional: Negative for chills and fever.  HENT: Negative.   Respiratory: Negative.  Negative for shortness of breath.   Gastrointestinal: Negative.   Musculoskeletal: Positive for back pain.  Skin: Negative.   Neurological: Negative.     Physical Exam Updated Vital Signs BP (!) 137/101 (BP Location: Left Arm)   Pulse (!) 110   Temp 98.7 F (37.1 C) (Oral)  Resp 16   LMP 12/27/2019   SpO2 100%   Physical Exam Vitals and nursing note reviewed.  Constitutional:      Appearance: She is well-developed.  HENT:     Head: Normocephalic.  Cardiovascular:     Rate and Rhythm: Normal rate and regular rhythm.  Pulmonary:     Effort: Pulmonary effort is normal.     Breath sounds: Normal breath sounds. No wheezing, rhonchi or rales.  Abdominal:     General: Bowel sounds are normal.     Palpations: Abdomen is soft.     Tenderness: There is no abdominal tenderness. There is no guarding or rebound.  Musculoskeletal:        General: Normal range of motion.     Cervical back: Normal range of motion and neck supple.       Back:  Skin:    General: Skin is warm and dry.     Findings: No rash.  Neurological:     Mental Status: She is alert.     Cranial Nerves: No cranial nerve deficit.     ED Results / Procedures / Treatments   Labs (all labs ordered are listed, but only abnormal results are displayed) Labs Reviewed - No data to  display  EKG None  Radiology DG Ribs Unilateral W/Chest Left  Result Date: 01/12/2020 CLINICAL DATA:  Initial evaluation for acute left-sided rib pain status post recent injury. EXAM: LEFT RIBS AND CHEST - 3+ VIEW COMPARISON:  None. FINDINGS: Cardiac and mediastinal silhouettes within normal limits. Lungs well inflated. No focal infiltrates. No edema or effusion. No pneumothorax. Dedicated views of the left ribs were performed. Metallic BB marker overlies the left posterior tenth ribs at site of pain. Immediately subjacent to the marker, there is subtle cortical irregularity at the left posterior tenth rib, which could reflect an acute and/or subacute nondisplaced rib fracture, best seen on oblique view. No other acute osseous abnormality. IMPRESSION: 1. Subtle cortical irregularity at the left posterior tenth rib, which could reflect an acute and/or subacute nondisplaced rib fracture. Correlation with physical exam recommended. 2. No other active cardiopulmonary disease. Electronically Signed   By: Rise Mu M.D.   On: 01/12/2020 03:14    Procedures Procedures (including critical care time)  Medications Ordered in ED Medications  ibuprofen (ADVIL) tablet 600 mg (600 mg Oral Given 01/12/20 0244)    ED Course  I have reviewed the triage vital signs and the nursing notes.  Pertinent labs & imaging results that were available during my care of the patient were reviewed by me and considered in my medical decision making (see chart for details).    MDM Rules/Calculators/A&P                          Patient to ED with ss/sxs per HPI.   CXR with left ribs shows acute vs subacute 10th rib ND fracture, favor subacute. Presentation and assessment suggests MSK pain, likely from heavy lifting. Doubt pulmonary infection or PE, dissection, ACS.   Final Clinical Impression(s) / ED Diagnoses Final diagnoses:  None   1. Back pain   Rx / DC Orders ED Discharge Orders    None        Danne Harbor 01/12/20 9532    Dione Booze, MD 01/12/20 [phone]

## 2020-11-30 ENCOUNTER — Encounter (HOSPITAL_COMMUNITY): Payer: Self-pay | Admitting: Emergency Medicine

## 2020-11-30 ENCOUNTER — Other Ambulatory Visit: Payer: Self-pay

## 2020-11-30 ENCOUNTER — Emergency Department (HOSPITAL_COMMUNITY): Payer: Self-pay

## 2020-11-30 ENCOUNTER — Emergency Department (HOSPITAL_COMMUNITY)
Admission: EM | Admit: 2020-11-30 | Discharge: 2020-12-01 | Disposition: A | Discharge status: Home / Self Care | Payer: Self-pay | Attending: Emergency Medicine | Admitting: Emergency Medicine

## 2020-11-30 ENCOUNTER — Ambulatory Visit: Payer: Self-pay | Admitting: *Deleted

## 2020-11-30 DIAGNOSIS — N390 Urinary tract infection, site not specified: Secondary | ICD-10-CM | POA: Insufficient documentation

## 2020-11-30 DIAGNOSIS — F17210 Nicotine dependence, cigarettes, uncomplicated: Secondary | ICD-10-CM | POA: Insufficient documentation

## 2020-11-30 DIAGNOSIS — B9689 Other specified bacterial agents as the cause of diseases classified elsewhere: Secondary | ICD-10-CM | POA: Insufficient documentation

## 2020-11-30 LAB — URINALYSIS, ROUTINE W REFLEX MICROSCOPIC
Bacteria, UA: NONE SEEN
Bilirubin Urine: NEGATIVE
Glucose, UA: NEGATIVE mg/dL
Hgb urine dipstick: NEGATIVE
Ketones, ur: 5 mg/dL — AB
Nitrite: NEGATIVE
Protein, ur: NEGATIVE mg/dL
Specific Gravity, Urine: 1.021 (ref 1.005–1.030)
pH: 5 (ref 5.0–8.0)

## 2020-11-30 LAB — CBC WITH DIFFERENTIAL/PLATELET
Abs Immature Granulocytes: 0.06 10*3/uL (ref 0.00–0.07)
Basophils Absolute: 0.1 10*3/uL (ref 0.0–0.1)
Basophils Relative: 0 %
Eosinophils Absolute: 0.1 10*3/uL (ref 0.0–0.5)
Eosinophils Relative: 0 %
HCT: 41 % (ref 36.0–46.0)
Hemoglobin: 13.6 g/dL (ref 12.0–15.0)
Immature Granulocytes: 0 %
Lymphocytes Relative: 19 %
Lymphs Abs: 2.9 10*3/uL (ref 0.7–4.0)
MCH: 32.9 pg (ref 26.0–34.0)
MCHC: 33.2 g/dL (ref 30.0–36.0)
MCV: 99 fL (ref 80.0–100.0)
Monocytes Absolute: 0.8 10*3/uL (ref 0.1–1.0)
Monocytes Relative: 5 %
Neutro Abs: 11.6 10*3/uL — ABNORMAL HIGH (ref 1.7–7.7)
Neutrophils Relative %: 76 %
Platelets: 407 10*3/uL — ABNORMAL HIGH (ref 150–400)
RBC: 4.14 MIL/uL (ref 3.87–5.11)
RDW: 13.8 % (ref 11.5–15.5)
WBC: 15.4 10*3/uL — ABNORMAL HIGH (ref 4.0–10.5)
nRBC: 0 % (ref 0.0–0.2)

## 2020-11-30 LAB — COMPREHENSIVE METABOLIC PANEL
ALT: 9 U/L (ref 0–44)
AST: 17 U/L (ref 15–41)
Albumin: 4.3 g/dL (ref 3.5–5.0)
Alkaline Phosphatase: 67 U/L (ref 38–126)
Anion gap: 8 (ref 5–15)
BUN: 13 mg/dL (ref 6–20)
CO2: 23 mmol/L (ref 22–32)
Calcium: 9 mg/dL (ref 8.9–10.3)
Chloride: 104 mmol/L (ref 98–111)
Creatinine, Ser: 0.64 mg/dL (ref 0.44–1.00)
GFR, Estimated: >60 mL/min (ref >60)
Glucose, Bld: 104 mg/dL — ABNORMAL HIGH (ref 70–99)
Potassium: 3.6 mmol/L (ref 3.5–5.1)
Sodium: 135 mmol/L (ref 135–145)
Total Bilirubin: 0.5 mg/dL (ref 0.3–1.2)
Total Protein: 7.8 g/dL (ref 6.5–8.1)

## 2020-11-30 LAB — PREGNANCY, URINE: Preg Test, Ur: NEGATIVE

## 2020-11-30 NOTE — ED Triage Notes (Signed)
Pt reports consistent left sided abdominal pain. Also reports excessive vaginal bleeding with clots. Pt states she is currently on her menstrual period and this is abnormal for her. Endorses diarrhea. Denies N/V, fevers, urinary sx.

## 2020-11-30 NOTE — ED Notes (Signed)
US at bedside

## 2020-11-30 NOTE — ED Provider Notes (Signed)
Emergency Medicine Provider Triage Evaluation Note  Laura English , a 40 y.o. female  was evaluated in triage.  Pt complains of left lower pelvic pain that started yesterday.  Patient reports a history of dysmenorrhea.  She states yesterday she began experiencing her left lower pelvic pain prior to the onset of her regular menstrual cycle.  She states she was bleeding today heavier than normal and her pain has persisted.  States her pain is also worse than normal.  Notes worsening pain when contracting her abdomen to urinate.  Denies any dysuria.  Also reports an episode of diarrhea this morning.  No vomiting.  Reports a history of tubal ligation and 3 prior pregnancies.  Physical Exam  BP 125/84   Pulse 96   Temp 98.5 F (36.9 C)   Resp 17   Ht 5\' 1"  (1.549 m)   Wt 55.3 kg   SpO2 100%   BMI 23.05 kg/m  Gen:   Awake, no distress   Resp:  Normal effort  MSK:   Moves extremities without difficulty  Other:    Medical Decision Making  Medically screening exam initiated at 9:59 PM.  Appropriate orders placed.  HAREEM SUROWIEC was informed that the remainder of the evaluation will be completed by another provider, this initial triage assessment does not replace that evaluation, and the importance of remaining in the ED until their evaluation is complete.   Clemmie Krill, PA-C 11/30/20 2200    01/30/21, MD 12/11/20 1538

## 2020-11-30 NOTE — Telephone Encounter (Signed)
Reason for Disposition  [1] SEVERE pain (e.g., excruciating cramps) AND [2] worse than ever before AND [3] not improved with ibuprofen or naproxen  Answer Assessment - Initial Assessment Questions 1. LOCATION: "Where does it hurt?"      Lower left abd area , back and thighs yesterday  2. ONSET: "When did this episode of pain begin?"       Yesterday 11/29/20 and some prior to started period cramps  3. SEVERITY: "How bad is the pain?" "Are you missing school or work because of the pain?"  (e.g., Scale 1-10; mild, moderate, or severe)   - MILD (1-3): doesn't interfere with normal activities, lasting 1-2 days    - MODERATE (4-7): interferes with normal activities (missing work or school), lasting 2-3 days, some associated GI symptoms    - SEVERE (8-10): excruciating pain, lasting 2-7 days, associated GI symptoms, pain radiating into thighs and back     Severe to thighs and back  4. VAGINAL BLEEDING: "Describe the bleeding that you are having." "How much bleeding is there?"    - SPOTTING: spotting, or pinkish / brownish mucous discharge; does not fill panty liner or pad    - MILD:  less than 1 pad / hour; less than patient's usual menstrual bleeding   - MODERATE: 1-2 pads / hour; 1 menstrual cup every 6 hours; small-medium blood clots (e.g., pea, grape, small coin)   - SEVERE: soaking 2 or more pads/hour for 2 or more hours; 1 menstrual cup every 2 hours; bleeding not contained by pads or continuous red blood from vagina; large blood clots (e.g., golf ball, large coin)      Passing clots smaller than grape , using tampons flow heavier than yesterday  5. MENSTRUAL HISTORY:  "When did this menstrual period begin?", "Is this a normal period for you?"       Yesterday  11/29/20 no worse pain than ususal but pain is constant and abd. Tender to touch  6. LMP:  "When did your last menstrual period begin?"     11/29/20 7. OTHER SYMPTOMS: "Do you have any other symptoms?" (e.g., back pain, diarrhea, dizzy or  lightheaded, fever, urination pain, vaginal discharge, vomiting)     Diarrhea, pain urinating and left side pain ,  8. PREGNANCY: "Is there any chance you are pregnant?" (e.g., unprotected intercourse, missed birth control pill, broken condom)     No, tubes tied x 10 years ago  Protocols used: Abdominal Pain - Menstrual Cramps-A-AH

## 2020-11-30 NOTE — Telephone Encounter (Signed)
C/o severe abdominal pain left lower abdomen to left hip area and back and thighs . Constant pain, abd tender to touch, taking aleeve for pain and decreases for apprx. 3 hours and returns. Started menstrual cycle yesterday 11/29/20. Reports clots smaller than grape size and flow normal but heavier today . Denies dizziness , lightheadedness. C/o diarrhea, painful urination, thigh and back pain . Hx 'tubes tied" x 10 years. No available appt . Need new provider due to last seen by [facility], MD. Patient requesting to set up appt with a provider in practice when available .recommended UC or ED for evaluation due to constant abd. Pain and tender to touch. Patient requesting to set up care with next available provider . Care advise given. Patient verbalized understanding of care advise and to call back or go to [facility] or ED if symptoms worsen.

## 2020-12-01 ENCOUNTER — Emergency Department (HOSPITAL_COMMUNITY): Payer: Self-pay

## 2020-12-01 ENCOUNTER — Encounter (HOSPITAL_COMMUNITY): Payer: Self-pay

## 2020-12-01 ENCOUNTER — Other Ambulatory Visit: Payer: Self-pay

## 2020-12-01 LAB — TYPE AND SCREEN
ABO/RH(D): O POS
Antibody Screen: NEGATIVE

## 2020-12-01 MED ORDER — IOHEXOL 350 MG/ML SOLN
80.0000 mL | Freq: Once | INTRAVENOUS | Status: AC | PRN
  Administered 2020-12-01: 80 mL via INTRAVENOUS

## 2020-12-01 MED ORDER — CEPHALEXIN 500 MG PO CAPS
500.0000 mg | ORAL_CAPSULE | Freq: Two times a day (BID) | ORAL | 0 refills | Status: AC
  Filled 2020-12-01: qty 10, 5d supply, fill #0

## 2020-12-01 NOTE — ED Notes (Signed)
Patient transported to CT 

## 2020-12-01 NOTE — ED Provider Notes (Signed)
[facility]-EMERGENCY DEPT Provider Note   CSN: [id number] Arrival date & time: 11/30/20  2135     History Chief Complaint  Patient presents with   Abdominal Pain    Laura English is a 40 y.o. female who presents to the ED today with complaint of gradual onset, constant, sharp/stabbing, left lower quadrant abdominal pain that began yesterday.  Patient does note that she started her menstrual cycle yesterday as well however this pain felt very different compared to regular menstrual cramping.  She does report she has been passing more clots than normal for the past day.  She does complain of some nausea however denies vomiting.  She had had some diarrhea.  She reports some burning with urination as well.  She called her PCP who could not get her in till Wednesday and advised that she either go to urgent care or the ED for further evaluation.  Patient reports history of tubal ligation.   The history is provided by the patient and medical records.      Past Medical History:  Diagnosis Date   Depression    hx depression - no meds - no current problems   Heartburn in pregnancy    on pepcid   No pertinent past medical history     Patient Active Problem List   Diagnosis Date Noted   Substance induced mood disorder (HCC) 10/22/2017   Carpal tunnel syndrome 06/04/2013   Insomnia 01/04/2013   Routine general medical examination at a health care facility 12/07/2012   GAD (generalized anxiety disorder) 12/07/2012   Hip pain 12/07/2012    Past Surgical History:  Procedure Laterality Date   CESAREAN SECTION     3x   TUBAL LIGATION       OB History     Gravida  3   Para  3   Term  3   Preterm      AB      Living  3      SAB      IAB      Ectopic      Multiple      Live Births  1           Family History  Problem Relation Age of Onset   Emphysema Mother    Dementia Maternal Grandfather    Alcohol abuse Father    Mental  illness Father    Mental illness Brother    Diabetes Maternal Grandmother     Social History   Tobacco Use   Smoking status: Every Day    Packs/day: 1.00    Years: 10.00    Pack years: 10.00    Types: Cigarettes   Smokeless tobacco: Never   Tobacco comments:    e- cig  Substance Use Topics   Alcohol use: No   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 Authorizing Provider  cephALEXin (KEFLEX) 500 MG capsule Take 1 capsule (500 mg total) by mouth 2 (two) times daily for 5 days. 12/01/20 12/06/20 Yes Tammey Deeg, PA-C  escitalopram (LEXAPRO) 10 MG tablet Take 1 tablet (10 mg total) by mouth at bedtime. . 12/11/17   Fulp, Cammie, MD  ibuprofen (ADVIL) 600 MG tablet TAKE 1 TABLET (600 MG TOTAL) BY MOUTH EVERY 6 (SIX) HOURS AS NEEDED. 01/12/20 01/11/21  Elpidio Anis,  PA-C  methocarbamol (ROBAXIN) 750 MG tablet TAKE 1 TABLET (750 MG TOTAL) BY MOUTH 2 (TWO) TIMES DAILY. 01/12/20 01/11/21  Elpidio Anis, PA-C  metroNIDAZOLE (FLAGYL) 500 MG tablet Take 1 tablet (500 mg total) by mouth 2 (two) times daily. 01/06/18   Cain Saupe, MD    Allergies    Patient has no known allergies.  Review of Systems   Review of Systems  Constitutional:  Negative for chills and fever.  Gastrointestinal:  Positive for abdominal pain, diarrhea and nausea. Negative for vomiting.  Genitourinary:  Positive for dysuria and vaginal bleeding. Negative for flank pain.  All other systems reviewed and are negative.  Physical Exam Updated Vital Signs BP 119/84   Pulse 78   Temp 98.2 F (36.8 C) (Oral)   Resp 18   Ht 5\' 1"  (1.549 m)   Wt 55.3 kg   SpO2 100%   BMI 23.05 kg/m   Physical Exam Vitals and nursing note reviewed.  Constitutional:      Appearance: She is not ill-appearing or diaphoretic.  HENT:     Head: Normocephalic and atraumatic.  Eyes:      Conjunctiva/sclera: Conjunctivae normal.  Cardiovascular:     Rate and Rhythm: Normal rate and regular rhythm.     Heart sounds: Normal heart sounds.  Pulmonary:     Effort: Pulmonary effort is normal.     Breath sounds: Normal breath sounds. No stridor. No wheezing, rhonchi or rales.  Abdominal:     Palpations: Abdomen is soft.     Tenderness: There is abdominal tenderness in the left lower quadrant. There is no right CVA tenderness, left CVA tenderness, guarding or rebound.  Musculoskeletal:     Cervical back: Neck supple.  Skin:    General: Skin is warm and dry.  Neurological:     Mental Status: She is alert.    ED Results / Procedures / Treatments   Labs (all labs ordered are listed, but only abnormal results are displayed) Labs Reviewed  COMPREHENSIVE METABOLIC PANEL - Abnormal; Notable for the following components:      Result Value   Glucose, Bld 104 (*)    All other components within normal limits  CBC WITH DIFFERENTIAL/PLATELET - Abnormal; Notable for the following components:   WBC 15.4 (*)    Platelets 407 (*)    Neutro Abs 11.6 (*)    All other components within normal limits  URINALYSIS, ROUTINE W REFLEX MICROSCOPIC - Abnormal; Notable for the following components:   APPearance HAZY (*)    Ketones, ur 5 (*)    Leukocytes,Ua MODERATE (*)    All other components within normal limits  URINE CULTURE  PREGNANCY, URINE  TYPE AND SCREEN    EKG None  Radiology US Transvaginal Non-OB  Result Date: 11/30/2020 CLINICAL DATA:  Left lower quadrant pain EXAM: TRANSABDOMINAL AND TRANSVAGINAL ULTRASOUND OF PELVIS DOPPLER ULTRASOUND OF OVARIES TECHNIQUE: Both transabdominal and transvaginal ultrasound examinations of the pelvis were performed. Transabdominal technique was performed for global imaging of the pelvis including uterus, ovaries, adnexal regions, and pelvic cul-de-sac. It was necessary to proceed with endovaginal exam following the transabdominal exam to  visualize the uterus endometrium ovaries. Color and duplex Doppler ultrasound was utilized to evaluate blood flow to the ovaries. COMPARISON:  Ultrasound report 04/03/2007 FINDINGS: Uterus Measurements: 9.8 x 4.6 x 5.9 cm = volume: 138.8 mL. No fibroids or other mass visualized. Endometrium Thickness: 4 mm.  No focal abnormality visualized. Right ovary Measurements: 2.6 x 1.7 x 2.3  cm = volume: 5.4 mL. Probable corpus luteum in the right ovary measuring 14 mm, no further follow-up recommended. Left ovary Measurements: 2.1 x 1.4 x 1.9 cm = volume: 3.1 mL. Normal appearance/no adnexal mass. Pulsed Doppler evaluation of both ovaries demonstrates normal low-resistance arterial and venous waveforms. Other findings No abnormal free fluid. IMPRESSION: Negative for ovarian torsion. Essentially negative pelvic ultrasound. Electronically Signed   By: Jasmine Pang M.D.   On: 11/30/2020 23:15   US Pelvis Complete  Result Date: 11/30/2020 CLINICAL DATA:  Left lower quadrant pain EXAM: TRANSABDOMINAL AND TRANSVAGINAL ULTRASOUND OF PELVIS DOPPLER ULTRASOUND OF OVARIES TECHNIQUE: Both transabdominal and transvaginal ultrasound examinations of the pelvis were performed. Transabdominal technique was performed for global imaging of the pelvis including uterus, ovaries, adnexal regions, and pelvic cul-de-sac. It was necessary to proceed with endovaginal exam following the transabdominal exam to visualize the uterus endometrium ovaries. Color and duplex Doppler ultrasound was utilized to evaluate blood flow to the ovaries. COMPARISON:  Ultrasound report 04/03/2007 FINDINGS: Uterus Measurements: 9.8 x 4.6 x 5.9 cm = volume: 138.8 mL. No fibroids or other mass visualized. Endometrium Thickness: 4 mm.  No focal abnormality visualized. Right ovary Measurements: 2.6 x 1.7 x 2.3 cm = volume: 5.4 mL. Probable corpus luteum in the right ovary measuring 14 mm, no further follow-up recommended. Left ovary Measurements: 2.1 x 1.4 x 1.9 cm =  volume: 3.1 mL. Normal appearance/no adnexal mass. Pulsed Doppler evaluation of both ovaries demonstrates normal low-resistance arterial and venous waveforms. Other findings No abnormal free fluid. IMPRESSION: Negative for ovarian torsion. Essentially negative pelvic ultrasound. Electronically Signed   By: Jasmine Pang M.D.   On: 11/30/2020 23:15   CT Abdomen Pelvis W Contrast  Result Date: 12/01/2020 CLINICAL DATA:  40 year old female with history of left lower quadrant abdominal pain and pelvic pain with increased bleeding during this menstrual. EXAM: CT ABDOMEN AND PELVIS WITH CONTRAST TECHNIQUE: Multidetector CT imaging of the abdomen and pelvis was performed using the standard protocol following bolus administration of intravenous contrast. CONTRAST:  23mL OMNIPAQUE IOHEXOL 350 MG/ML SOLN COMPARISON:  No priors. FINDINGS: Lower chest: Unremarkable. Hepatobiliary: No suspicious cystic or solid hepatic lesions. No intra or extrahepatic biliary ductal dilatation. Gallbladder is normal in appearance. Pancreas: No pancreatic mass. No pancreatic ductal dilatation. No pancreatic or peripancreatic fluid collections or inflammatory changes. Spleen: Unremarkable. Adrenals/Urinary Tract: Bilateral kidneys and bilateral adrenal glands are normal in appearance. No hydroureteronephrosis. Urinary bladder is normal in appearance. Stomach/Bowel: The appearance of the stomach is normal. There is no pathologic dilatation of small bowel or colon. Normal appendix. 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 No aneurysm or dissection noted in the abdominal or pelvic vasculature. No lymphadenopathy identified in the abdomen or pelvis. Reproductive: Uterus and ovaries are unremarkable in appearance. Tampon present in the vagina. Other: No significant volume of ascites.  No pneumoperitoneum. Musculoskeletal: There are no aggressive appearing lytic or blastic lesions noted in the visualized portions of the  skeleton. IMPRESSION: 1. No acute findings are noted in the abdomen or pelvis. 2. 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 Electronically Signed   By: Trudie Reed M.D.   On: 12/01/2020 05:23   [place] Art/Ven Flow Abd Pelv Doppler  Result Date: 11/30/2020 CLINICAL DATA:  Left lower quadrant pain EXAM: TRANSABDOMINAL AND TRANSVAGINAL ULTRASOUND OF PELVIS DOPPLER ULTRASOUND OF OVARIES TECHNIQUE: Both transabdominal and transvaginal ultrasound examinations of the pelvis were performed. Transabdominal technique was performed for global imaging of the pelvis including uterus, ovaries, adnexal regions, and  pelvic cul-de-sac. It was necessary to proceed with endovaginal exam following the transabdominal exam to visualize the uterus endometrium ovaries. Color and duplex Doppler ultrasound was utilized to evaluate blood flow to the ovaries. COMPARISON:  Ultrasound report 04/03/2007 FINDINGS: Uterus Measurements: 9.8 x 4.6 x 5.9 cm = volume: 138.8 mL. No fibroids or other mass visualized. Endometrium Thickness: 4 mm.  No focal abnormality visualized. Right ovary Measurements: 2.6 x 1.7 x 2.3 cm = volume: 5.4 mL. Probable corpus luteum in the right ovary measuring 14 mm, no further follow-up recommended. Left ovary Measurements: 2.1 x 1.4 x 1.9 cm = volume: 3.1 mL. Normal appearance/no adnexal mass. Pulsed Doppler evaluation of both ovaries demonstrates normal low-resistance arterial and venous waveforms. Other findings No abnormal free fluid. IMPRESSION: Negative for ovarian torsion. Essentially negative pelvic ultrasound. Electronically Signed   By: Jasmine Pang M.D.   On: 11/30/2020 23:15    Procedures Procedures   Medications Ordered in ED Medications  iohexol (OMNIPAQUE) 350 MG/ML injection 80 mL (80 mLs Intravenous Contrast Given 12/01/20 0427)    ED Course  I have reviewed the triage vital signs and the nursing notes.  Pertinent labs & imaging results that were available during my care of the  patient were reviewed by me and considered in my medical decision making (see chart for details).    MDM Rules/Calculators/A&P                           40 year old female who presents to the ED today with complaints of left lower quadrant abdominal pain that began yesterday with associated nausea, diarrhea, some burning with urination.  Also is currently on her menstrual cycle with larger than normal clotting.  On arrival to the ED vitals are stable.  Patient was medically screened and work-up started including a CBC, CMP, pregnancy test, urinalysis as well as an ultrasound.  CBC has returned with a elevated leukocytosis of 15,400.  No left shift.  CMP without electrolyte abnormalities.  Urine pregnancy test negative.  UA with moderate leuks, 6-10 red blood cells, likely from her bleeding.  Also 21-50 white blood cells with some mucus present.  Her ultrasound is negative for any acute findings.  On my exam she continues to have left lower quadrant abdominal tenderness palpation.  Question if this is typical UTI type pain however given left lower quadrant pain and leukocytosis concern for possible diverticulitis however patient is younger than would expect for this.  We will plan for CT scan for further evaluation.  If negative will treat for UTI.  CT negative for acute findings. Will treat for UTI and discharge home with PCP follow up. Pt in agreement with plan at this time.   This note was prepared using Dragon voice recognition software and may include unintentional dictation errors due to the inherent limitations of voice recognition software.   Final Clinical Impression(s) / ED Diagnoses Final diagnoses:  Lower urinary tract infectious disease    Rx / DC Orders ED Discharge Orders          Ordered    cephALEXin (KEFLEX) 500 MG capsule  2 times daily        12/01/20 0528             Discharge Instructions      Your CT scan did not show any acute abnormalities.   Please pick  up antibiotics and take as prescribed for your urinary tract infection  Follow up  with your PCP regarding ED visit today. You should have your urine rechecked in 1-2 weeks to ensure you have cleared the infection.   Return to the ED for any new/worsening symptoms       Tanda Rockers, PA-C 12/01/20 [id number]    Geoffery Lyons, MD 12/01/20 [id number]

## 2020-12-01 NOTE — Discharge Instructions (Addendum)
Your CT scan did not show any acute abnormalities.   Please pick up antibiotics and take as prescribed for your urinary tract infection  Follow up with your PCP regarding ED visit today. You should have your urine rechecked in 1-2 weeks to ensure you have cleared the infection.   Return to the ED for any new/worsening symptoms

## 2020-12-02 LAB — URINE CULTURE: Culture: NO GROWTH

## 2020-12-06 ENCOUNTER — Encounter: Payer: Self-pay | Admitting: Family Medicine

## 2020-12-06 ENCOUNTER — Other Ambulatory Visit: Payer: Self-pay

## 2020-12-06 ENCOUNTER — Ambulatory Visit: Payer: Self-pay | Attending: Family Medicine | Admitting: Family Medicine

## 2020-12-06 VITALS — BP 111/71 | HR 80 | Ht 61.0 in | Wt 117.6 lb

## 2020-12-06 DIAGNOSIS — K2960 Other gastritis without bleeding: Secondary | ICD-10-CM

## 2020-12-06 DIAGNOSIS — F411 Generalized anxiety disorder: Secondary | ICD-10-CM

## 2020-12-06 DIAGNOSIS — F3131 Bipolar disorder, current episode depressed, mild: Secondary | ICD-10-CM

## 2020-12-06 MED ORDER — OMEPRAZOLE 40 MG PO CPDR
40.0000 mg | DELAYED_RELEASE_CAPSULE | Freq: Every day | ORAL | 1 refills | Status: DC
  Filled 2020-12-06: qty 30, 30d supply, fill #0

## 2020-12-06 MED ORDER — ESCITALOPRAM OXALATE 10 MG PO TABS
10.0000 mg | ORAL_TABLET | Freq: Every day | ORAL | 1 refills | Status: DC
Start: 2020-12-06 — End: 2021-05-09
  Filled 2020-12-06: qty 30, 30d supply, fill #0

## 2020-12-06 NOTE — Progress Notes (Signed)
Subjective:  Patient ID: Laura English, female    DOB: 19-Jan-1981  Age: 40 y.o. MRN: [id number]  CC: Depression and Abdominal Pain   HPI Laura English is a 40 y.o. 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  Interval History: Diagnosed with PTSD and Bipolar disorder by [facility] and was on medications but states they were not helping and just wanted to talk about about her previous drug use. She gets easily angry and it is difficult to calm down when she does. One thing leads to another and she spirals.  She finds herself more sad than happy.  Denies suicidal intents or ideations. She was on Lexapro which she states kept her happy.  Complains of nausea on waking up in her epigastric region to the point that she feels she might throw up. No excessive burping. She has a lot of diarrhea but no hematochezia.  Pian is an ache and is sometimes occurring after meals.  Past Medical History:  Diagnosis Date   Depression    hx depression - no meds - no current problems   Heartburn in pregnancy    on pepcid   No pertinent past medical history     Past Surgical History:  Procedure Laterality Date   CESAREAN SECTION     3x   TUBAL LIGATION      Family History  Problem Relation Age of Onset   Emphysema Mother    Dementia Maternal Grandfather    Alcohol abuse Father    Mental illness Father    Mental illness Brother    Diabetes Maternal Grandmother     No Known Allergies  Outpatient Medications Prior to Visit  Medication Sig Dispense Refill   cephALEXin (KEFLEX) 500 MG capsule Take 1 capsule (500 mg total) by mouth 2 (two) times daily for 5 days. 10 capsule 0   ibuprofen (ADVIL) 600 MG tablet TAKE 1 TABLET (600 MG TOTAL) BY MOUTH EVERY 6 (SIX) HOURS AS NEEDED. (Patient not taking: Reported on 12/06/2020) 30 tablet 0   methocarbamol (ROBAXIN) 750 MG tablet TAKE 1 TABLET (750 MG TOTAL) BY MOUTH 2 (TWO)  TIMES DAILY. (Patient not taking: Reported on 12/06/2020) 14 tablet 0   metroNIDAZOLE (FLAGYL) 500 MG tablet Take 1 tablet (500 mg total) by mouth 2 (two) times daily. (Patient not taking: Reported on 12/06/2020) 14 tablet 0   escitalopram (LEXAPRO) 10 MG tablet Take 1 tablet (10 mg total) by mouth at bedtime. . (Patient not taking: Reported on 12/06/2020) 30 tablet 1   No facility-administered medications prior to visit.     ROS Review of Systems  Constitutional:  Negative for activity change, appetite change and fatigue.  HENT:  Negative for congestion, sinus pressure and sore throat.   Eyes:  Negative for visual disturbance.  Respiratory:  Negative for cough, chest tightness, shortness of breath and wheezing.   Cardiovascular:  Negative for chest pain and palpitations.  Gastrointestinal:  Positive for abdominal pain and nausea. Negative for abdominal distention and constipation.  Endocrine: Negative for polydipsia.  Genitourinary:  Negative for dysuria and frequency.  Musculoskeletal:  Negative for arthralgias and back pain.  Skin:  Negative for rash.  Neurological:  Negative for tremors, light-headedness and numbness.  Hematological:  Does not bruise/bleed easily.  Psychiatric/Behavioral:  Positive for dysphoric mood. Negative for agitation and behavioral problems.    Objective:  BP 111/71   Pulse 80   Ht  5\' 1"  (1.549 m)   Wt 117 lb 9.6 oz (53.3 kg)   SpO2 99%   BMI 22.22 kg/m   BP/Weight 12/06/2020 12/01/2020 11/30/2020  Systolic BP 111 119 -  Diastolic BP 71 84 -  Wt. (Lbs) 117.6 - 122  BMI 22.22 - 23.05      Physical Exam Constitutional:      Appearance: She is well-developed.  Cardiovascular:     Rate and Rhythm: Normal rate.     Heart sounds: Normal heart sounds. No murmur heard. Pulmonary:     Effort: Pulmonary effort is normal.     Breath sounds: Normal breath sounds. No wheezing or rales.  Chest:     Chest wall: No tenderness.  Abdominal:     General:  Bowel sounds are normal. There is no distension.     Palpations: Abdomen is soft. There is no mass.     Tenderness: There is no abdominal tenderness.  Musculoskeletal:        General: Normal range of motion.     Right lower leg: No edema.     Left lower leg: No edema.  Neurological:     Mental Status: She is alert and oriented to person, place, and time.  Psychiatric:     Comments: Dysphoric mood    CMP Latest Ref Rng & Units 11/30/2020 01/08/2018 10/21/2017  Glucose 70 - 99 mg/dL 10/23/2017) 84 95  BUN 6 - 20 mg/dL 13 9 6   Creatinine 0.44 - 1.00 mg/dL 706(C 3.76  Sodium 135 - 145 mmol/L 135 139 141  Potassium 3.5 - 5.1 mmol/L 3.6 4.7 4.3  Chloride 98 - 111 mmol/L 104 105 109  CO2 22 - 32 mmol/L 23 20 28   Calcium 8.9 - 10.3 mg/dL 9.0 9.1 9.2  Total Protein 6.5 - 8.1 g/dL 7.8 6.5 7.0  Total Bilirubin 0.3 - 1.2 mg/dL 0.5 0.3 0.3  Alkaline Phos 38 - 126 U/L 67 74 58  AST 15 - 41 U/L 17 13 18   ALT 0 - 44 U/L 9 10 15     Lipid Panel     Component Value Date/Time   CHOL 165 01/08/2018 0943   TRIG 110 01/08/2018 0943   HDL 45 01/08/2018 0943   CHOLHDL 3.7 01/08/2018 0943   CHOLHDL 3.7 12/07/2012 1221   VLDL 19 12/07/2012 1221   LDLCALC 98 01/08/2018 0943    CBC    Component Value Date/Time   WBC 15.4 (H) 11/30/2020 2219   RBC 4.14 11/30/2020 2219   HGB 13.6 11/30/2020 2219   HCT 41.0 11/30/2020 2219   PLT 407 (H) 11/30/2020 2219   MCV 99.0 11/30/2020 2219   MCH 32.9 11/30/2020 2219   MCHC 33.2 11/30/2020 2219   RDW 13.8 11/30/2020 2219   LYMPHSABS 2.9 11/30/2020 2219   MONOABS 0.8 11/30/2020 2219   EOSABS 0.1 11/30/2020 2219   BASOSABS 0.1 11/30/2020 2219      Assessment & Plan:  1. Bipolar affective disorder, currently depressed, mild (HCC) Uncontrolled Currently depressed but PHQ-9 is 13 Given she previously did well on Lexapro I will place her on Lexapro and reassess in 6 weeks Discussed that if symptoms are not fully optimized a mood stabilizer will be a  better option - escitalopram (LEXAPRO) 10 MG tablet; Take 1 tablet (10 mg total) by mouth at bedtime. .  Dispense: 30 tablet; Refill: 1  2. GAD (generalized anxiety disorder) Placed on Lexapro  3. Other gastritis without hemorrhage, unspecified chronicity Will need to exclude  H. pylori - omeprazole (PRILOSEC) 40 MG capsule; Take 1 capsule (40 mg total) by mouth daily.  Dispense: 30 capsule; Refill: 1 - H. pylori breath test    Meds ordered this encounter  Medications   escitalopram (LEXAPRO) 10 MG tablet    Sig: Take 1 tablet (10 mg total) by mouth at bedtime. .    Dispense:  30 tablet    Refill:  1   omeprazole (PRILOSEC) 40 MG capsule    Sig: Take 1 capsule (40 mg total) by mouth daily.    Dispense:  30 capsule    Refill:  1     Follow-up: Return in about 6 weeks (around 01/17/2021) for Follow-up on depression.       Hoy Register, MD, FAAFP. [facility] [place], [place] [phone]   12/06/2020, 11:23 AM

## 2020-12-07 ENCOUNTER — Other Ambulatory Visit: Payer: Self-pay

## 2020-12-07 LAB — H. PYLORI BREATH TEST: H pylori Breath Test: NEGATIVE

## 2020-12-08 ENCOUNTER — Other Ambulatory Visit: Payer: Self-pay

## 2020-12-08 ENCOUNTER — Telehealth: Payer: Self-pay

## 2020-12-08 NOTE — Telephone Encounter (Signed)
Patient has viewed results via mychart  

## 2020-12-08 NOTE — Telephone Encounter (Signed)
-----   Message from Hoy Register, MD sent at 12/08/2020 12:34 PM EDT ----- Please inform the patient that labs are normal. Thank you.

## 2021-01-17 ENCOUNTER — Ambulatory Visit: Payer: Self-pay | Admitting: Family Medicine

## 2021-05-01 ENCOUNTER — Other Ambulatory Visit: Payer: Self-pay

## 2021-05-01 ENCOUNTER — Ambulatory Visit: Payer: Self-pay | Admitting: Physician Assistant

## 2021-05-01 ENCOUNTER — Ambulatory Visit: Payer: Self-pay

## 2021-05-01 ENCOUNTER — Other Ambulatory Visit: Payer: Self-pay | Admitting: *Deleted

## 2021-05-01 VITALS — BP 119/85 | HR 80 | Temp 97.7°F | Resp 18 | Ht 62.0 in | Wt 119.0 lb

## 2021-05-01 DIAGNOSIS — R6889 Other general symptoms and signs: Secondary | ICD-10-CM

## 2021-05-01 DIAGNOSIS — R059 Cough, unspecified: Secondary | ICD-10-CM

## 2021-05-01 LAB — POC COVID19 BINAXNOW: SARS Coronavirus 2 Ag: NEGATIVE

## 2021-05-01 MED ORDER — OSELTAMIVIR PHOSPHATE 75 MG PO CAPS
75.0000 mg | ORAL_CAPSULE | Freq: Two times a day (BID) | ORAL | 0 refills | Status: AC
  Filled 2021-05-01: qty 10, 5d supply, fill #0

## 2021-05-01 NOTE — Progress Notes (Signed)
Established Patient Office Visit  Subjective:  Patient ID: Laura English, female    DOB: [birth year]  Age: 41 y.o. MRN: [id number]  CC:  Chief Complaint  Patient presents with   Cough   URI    HPI JANYSSA MISENCIK reports that she started feeling poorly yesterday, has had a dry cough, sore throat, body aches, nausea, episode of diarrhea the day before, measured temperature of 100 last night and again this morning.  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  Reports she is drinking okay but has low appetite and states her throat hurts when she eats.  Reports that she has been taking cold medication over-the-counter with minimal relief.  Past Medical History:  Diagnosis Date   Depression    hx depression - no meds - no current problems   Heartburn in pregnancy    on pepcid   No pertinent past medical history     Past Surgical History:  Procedure Laterality Date   CESAREAN SECTION     3x   TUBAL LIGATION      Family History  Problem Relation Age of Onset   Emphysema Mother    Dementia Maternal Grandfather    Alcohol abuse Father    Mental illness Father    Mental illness Brother    Diabetes Maternal Grandmother     Social History   Socioeconomic History   Marital status: Single    Spouse name: Not on file   Number of children: Not on file   Years of education: Not on file   Highest education level: Not on file  Occupational History   Not on file  Tobacco Use   Smoking status: Every Day    Packs/day: 1.00    Years: 10.00    Pack years: 10.00    Types: Cigarettes   Smokeless tobacco: Never   Tobacco comments:    e- cig  Substance and Sexual Activity   Alcohol use: No   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   Sexual activity: Yes    Birth control/protection: None  Other Topics Concern   Not on file  Social History Narrative   Not on file    Social Determinants of Health   Financial Resource Strain: Not on file  Food Insecurity: Not on file  Transportation Needs: Not on file  Physical Activity: Not on file  Stress: Not on file  Social Connections: Not on file  Intimate Partner Violence: Not on file    Outpatient Medications Prior to Visit  Medication Sig Dispense Refill   escitalopram (LEXAPRO) 10 MG tablet Take 1 tablet (10 mg total) by mouth at bedtime. . (Patient not taking: Reported on 05/01/2021) 30 tablet 1   omeprazole (PRILOSEC) 40 MG capsule Take 1 capsule (40 mg total) by mouth daily. (Patient not taking: Reported on 05/01/2021) 30 capsule 1   metroNIDAZOLE (FLAGYL) 500 MG tablet Take 1 tablet (500 mg total) by mouth 2 (two) times daily. (Patient not taking: Reported on 12/06/2020) 14 tablet 0   No facility-administered medications prior to visit.    No Known Allergies  ROS Review of Systems  Constitutional:  Positive for chills and fever.  HENT:  Positive for sore throat and trouble swallowing. Negative for sinus pressure and sinus pain.   Eyes: Negative.   Respiratory:  Positive for cough. Negative for  shortness of breath and wheezing.   Cardiovascular:  Negative for chest pain.  Gastrointestinal:  Positive for diarrhea and nausea. Negative for vomiting.  Endocrine: Negative.   Genitourinary: Negative.   Musculoskeletal:  Positive for myalgias.  Skin: Negative.   Allergic/Immunologic: Negative.   Neurological: Negative.   Hematological: Negative.   Psychiatric/Behavioral: Negative.       Objective:    Physical Exam Vitals and nursing note reviewed.  Constitutional:      Appearance: Normal appearance.  HENT:     Head: Normocephalic and atraumatic.     Salivary Glands: Right salivary gland is not diffusely enlarged or tender. Left salivary gland is not diffusely enlarged or tender.     Right Ear: Tympanic membrane, ear canal and external ear normal.     Left Ear: Tympanic membrane, ear canal  and external ear normal.     Nose:     Right Turbinates: Swollen.     Left Turbinates: Swollen.     Right Sinus: No maxillary sinus tenderness or frontal sinus tenderness.     Left Sinus: No maxillary sinus tenderness or frontal sinus tenderness.     Mouth/Throat:     Lips: Pink.     Mouth: Mucous membranes are moist.     Pharynx: Posterior oropharyngeal erythema present. No pharyngeal swelling.     Tonsils: 0 on the right. 0 on the left.  Cardiovascular:     Rate and Rhythm: Normal rate and regular rhythm.     Pulses: Normal pulses.     Heart sounds: Normal heart sounds.  Pulmonary:     Effort: Pulmonary effort is normal.     Breath sounds: Normal breath sounds.  Musculoskeletal:        General: Normal range of motion.     Cervical back: Normal range of motion and neck supple.  Lymphadenopathy:     Cervical: No cervical adenopathy.  Skin:    General: Skin is warm and dry.  Neurological:     General: No focal deficit present.     Mental Status: She is alert and oriented to person, place, and time.  Psychiatric:        Mood and Affect: Mood normal.        Behavior: Behavior normal.        Thought Content: Thought content normal.        Judgment: Judgment normal.    BP 119/85 (BP Location: Left Arm, Patient Position: Sitting, Cuff Size: Normal)    Pulse 80    Temp 97.7 F (36.5 C)    Resp 18    Ht 5\' 2"  (1.575 m)    Wt 119 lb (54 kg)    SpO2 99%    BMI 21.77 kg/m  Wt Readings from Last 3 Encounters:  05/01/21 119 lb (54 kg)  12/06/20 117 lb 9.6 oz (53.3 kg)  11/30/20 122 lb (55.3 kg)     Health Maintenance Due  Topic Date Due   COVID-19 Vaccine (1) Never done   TETANUS/TDAP  02/26/2015   INFLUENZA VACCINE  09/25/2020   PAP SMEAR-Modifier  01/01/2021    There are no preventive care reminders to display for this patient.  No results found for: TSH Lab Results  Component Value Date   WBC 15.4 (H) 11/30/2020   HGB 13.6 11/30/2020   HCT 41.0 11/30/2020   MCV 99.0  11/30/2020   PLT 407 (H) 11/30/2020   Lab Results  Component Value Date   NA 135 11/30/2020  K 3.6 11/30/2020   CO2 23 11/30/2020   GLUCOSE 104 (H) 11/30/2020   BUN 13 11/30/2020   CREATININE 0.64 11/30/2020   BILITOT 0.5 11/30/2020   ALKPHOS 67 11/30/2020   AST 17 11/30/2020   ALT 9 11/30/2020   PROT 7.8 11/30/2020   ALBUMIN 4.3 11/30/2020   CALCIUM 9.0 11/30/2020   ANIONGAP 8 11/30/2020   Lab Results  Component Value Date   CHOL 165 01/08/2018   Lab Results  Component Value Date   HDL 45 01/08/2018   Lab Results  Component Value Date   LDLCALC 98 01/08/2018   Lab Results  Component Value Date   TRIG 110 01/08/2018   Lab Results  Component Value Date   CHOLHDL 3.7 01/08/2018   No results found for: HGBA1C    Assessment & Plan:   Problem List Items Addressed This Visit   None Visit Diagnoses     Flu-like symptoms    -  Primary   Relevant Medications   oseltamivir (TAMIFLU) 75 MG capsule   Other Relevant Orders   COVID-19, Flu A+B and RSV   Cough, unspecified type       Relevant Orders   POC COVID-19 (Completed)       Meds ordered this encounter  Medications   oseltamivir (TAMIFLU) 75 MG capsule    Sig: Take 1 capsule (75 mg total) by mouth 2 (two) times daily for 5 days.    Dispense:  10 capsule    Refill:  0    Order Specific Question:   Supervising Provider    Answer:   WRIGHT, PATRICK E [[id number]]   1. Flu-like symptoms Rapid COVID test negative, patient was sent to [facility] at [facility] for flu swab and RSV swab.  Given flulike symptoms, trial Tamiflu, patient education given on supportive care, red flags given for prompt reevaluation. - oseltamivir (TAMIFLU) 75 MG capsule; Take 1 capsule (75 mg total) by mouth 2 (two) times daily for 5 days.  Dispense: 10 capsule; Refill: 0 - COVID-19, Flu A+B and RSV  2. Cough, unspecified type  - POC COVID-19   I have reviewed the patient's medical history (PMH, PSH, Social History, Family History,  Medications, and allergies) , and have been updated if relevant. I spent 30 minutes reviewing chart and  face to face time with patient.    Follow-up: Return if symptoms worsen or fail to improve.    Kasandra Knudsen Mayers, PA-C

## 2021-05-01 NOTE — Patient Instructions (Signed)
You are going to take Tamiflu twice a day for the next 5 days.  I encourage you to continue using over-the-counter cold medication as needed, make sure you are getting plenty of rest and staying very well-hydrated. ? ?I hope that you feel better soon, please let us know if there is anything else we can do for you. ? ?Roney Jaffe, PA-C ?Physician Assistant ?[facility] ?[email or web address] ? ? ?Influenza, Adult ?Influenza, also called "the flu," is a viral infection that mainly affects the respiratory tract. This includes the lungs, nose, and throat. The flu spreads easily from person to person (is contagious). It causes common cold symptoms, along with high fever and body aches. ?What are the causes? ?This condition is caused by the influenza virus. You can get the virus by: ?Breathing in droplets that are in the air from an infected person's cough or sneeze. ?Touching something that has the virus on it (has been contaminated) and then touching your mouth, nose, or eyes. ?What increases the risk? ?The following factors may make you more likely to get the flu: ?Not washing or sanitizing your hands often. ?Having close contact with many people during cold and flu season. ?Touching your mouth, eyes, or nose without first washing or sanitizing your hands. ?Not getting an annual flu shot. ?You may have a higher risk for the flu, including serious problems, such as a lung infection (pneumonia), if you: ?Are older than 65. ?Are pregnant. ?Have a weakened disease-fighting system (immune system). This includes people who have HIV or AIDS, are on chemotherapy, or are taking medicines that reduce (suppress) the immune system. ?Have a long-term (chronic) illness, such as heart disease, kidney disease, diabetes, or lung disease. ?Have a liver disorder. ?Are severely overweight (morbidly obese). ?Have anemia. ?Have asthma. ?What are the signs or symptoms? ?Symptoms of this  condition usually begin suddenly and last 4-14 days. These may include: ?Fever and chills. ?Headaches, body aches, or muscle aches. ?Sore throat. ?Cough. ?Runny or stuffy (congested) nose. ?Chest discomfort. ?Poor appetite. ?Weakness or fatigue. ?Dizziness. ?Nausea or vomiting. ?How is this diagnosed? ?This condition may be diagnosed based on: ?Your symptoms and medical history. ?A physical exam. ?Swabbing your nose or throat and testing the fluid for the influenza virus. ?How is this treated? ?If the flu is diagnosed early, you can be treated with antiviral medicine that is given by mouth (orally) or through an IV. This can help reduce how severe the illness is and how long it lasts. ?Taking care of yourself at home can help relieve symptoms. Your health care provider may recommend: ?Taking over-the-counter medicines. ?Drinking plenty of fluids. ?In many cases, the flu goes away on its own. If you have severe symptoms or complications, you may be treated in a hospital. ?Follow these instructions at home: ?Activity ?Rest as needed and get plenty of sleep. ?Stay home from work or school as told by your health care provider. Unless you are visiting your health care provider, avoid leaving home until your fever has been gone for 24 hours without taking medicine. ?Eating and drinking ?Take an oral rehydration solution (ORS). This is a drink that is sold at pharmacies and retail stores. ?Drink enough fluid to keep your urine pale yellow. ?Drink clear fluids in small amounts as you are able. Clear fluids include water, ice chips, fruit juice mixed with water, and low-calorie sports drinks. ?Eat bland, easy-to-digest foods in small amounts as you are able. These foods include bananas, applesauce, rice, lean meats,  toast, and crackers. ?Avoid drinking fluids that contain a lot of sugar or caffeine, such as energy drinks, regular sports drinks, and soda. ?Avoid alcohol. ?Avoid spicy or fatty foods. ?General instructions ?   ?Take over-the-counter and prescription medicines only as told by your health care provider. ?Use a cool mist humidifier to add humidity to the air in your home. This can make it easier to breathe. ?When using a cool mist humidifier, clean it daily. Empty the water and replace it with clean water. ?Cover your mouth and nose when you cough or sneeze. ?Wash your hands with soap and water often and for at least 20 seconds, especially after you cough or sneeze. If soap and water are not available, use alcohol-based hand sanitizer. ?Keep all follow-up visits. This is important. ?How is this prevented? ? ?Get an annual flu shot. This is usually available in late summer, fall, or winter. Ask your health care provider when you should get your flu shot. ?Avoid contact with people who are sick during cold and flu season. This is generally fall and winter. ?Contact a health care provider if: ?You develop new symptoms. ?You have: ?Chest pain. ?Diarrhea. ?A fever. ?Your cough gets worse. ?You produce more mucus. ?You feel nauseous or you vomit. ?Get help right away if you: ?Develop shortness of breath or have difficulty breathing. ?Have skin or nails that turn a bluish color. ?Have severe pain or stiffness in your neck. ?Develop a sudden headache or sudden pain in your face or ear. ?Cannot eat or drink without vomiting. ?These symptoms may represent a serious problem that is an emergency. Do not wait to see if the symptoms will go away. Get medical help right away. Call your local emergency services (911 in the [place] Do not drive yourself to the hospital. ?Summary ?Influenza, also called "the flu," is a viral infection that primarily affects your respiratory tract. ?Symptoms of the flu usually begin suddenly and last 4-14 days. ?Getting an annual flu shot is the best way to prevent getting the flu. ?Stay home from work or school as told by your health care provider. Unless you are visiting your health care provider, avoid  leaving home until your fever has been gone for 24 hours without taking medicine. ?Keep all follow-up visits. This is important. ?This information is not intended to replace advice given to you by your health care provider. Make sure you discuss any questions you have with your health care provider. ?Document Revised: 10/01/2019 Document Reviewed: 10/01/2019 ?Elsevier Patient Education ? 2022 [employer] ? ?

## 2021-05-01 NOTE — Telephone Encounter (Signed)
?  Chief Complaint: Cold S/S ?Symptoms: sore throat, cough, fever BA, joint pain ?Frequency: since yesterday ?Pertinent Negatives: Patient denies dizziness, high fever. ?Disposition: [] ED /[x] Urgent Care (no appt availability in office) / [] Appointment(In office/virtual)/ []  [facility]/ [] Home Care/ [] Refused Recommended Disposition /[] [facility]/ []  Follow-up with PCP ?Additional Notes: Pt will go to the mobile clinic. Pt needs COVID and Flu testing prior to RTW.  ? ?Summary: cold and flu like symptoms  ? The patient has been experiencing cold and flu like symptoms  ? ?The patient has experienced an elevated temperature, body aches, cough and congestion for more than a week  ? ?The patient would like to discuss further with a member of staff  ? ?The patient was also told they could not return to work until they were tested for the flu  ? ?Please contact further   ?  ? ?Reason for Disposition ? Common cold with no complications ? ?Answer Assessment - Initial Assessment Questions ?1. ONSET: "When did the nasal discharge start?" NA ?    ?2. AMOUNT: "How much discharge is there?"  ?    none ?3. COUGH: "Do you have a cough?" If yes, ask: "Describe the color of your sputum" (clear, white, yellow, green) ?    no ?4. RESPIRATORY DISTRESS: "Describe your breathing."  ?    Wheezy ?5. FEVER: "Do you have a fever?" If Yes, ask: "What is your temperature, how was it measured, and when did it start?" ?    100 ?6. SEVERITY: "Overall, how bad are you feeling right now?" (e.g., doesn't interfere with normal activities, staying home from school/work, staying in bed)  ?    Drained ?7. OTHER SYMPTOMS: "Do you have any other symptoms?" (e.g., sore throat, earache, wheezing, vomiting) ?    Sore throat, fever, cough ?8. PREGNANCY: "Is there any chance you are pregnant?" "When was your last menstrual period?" ?    na ? ?Protocols used: Common Cold-A-AH ? ?

## 2021-05-01 NOTE — Progress Notes (Signed)
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  ?Patient reports body aches and cough being present. ? Patient has not eaten or taken medications this morning.  ?

## 2021-05-02 ENCOUNTER — Other Ambulatory Visit: Payer: Self-pay | Admitting: Physician Assistant

## 2021-05-02 ENCOUNTER — Other Ambulatory Visit: Payer: Self-pay

## 2021-05-02 ENCOUNTER — Ambulatory Visit: Payer: Self-pay

## 2021-05-02 DIAGNOSIS — R059 Cough, unspecified: Secondary | ICD-10-CM

## 2021-05-02 MED ORDER — BENZONATATE 100 MG PO CAPS
200.0000 mg | ORAL_CAPSULE | Freq: Two times a day (BID) | ORAL | 0 refills | Status: DC | PRN
  Filled 2021-05-02: qty 40, 10d supply, fill #0

## 2021-05-02 NOTE — Telephone Encounter (Signed)
Pt called in , has covid, asking for something to help cpugh that she can't get rid of. [facility] at [facility]  ?Phone: [phone]  ?Fax: [phone]  ? ? ? ?Chief Complaint: Non-productive cough. Seen 05/01/21. ?Symptoms: Severe, keeping pt. Awake at night. Has tried OTC. ?Frequency: Monday ?Pertinent Negatives: Patient denies fever ?Disposition: [] ED /[] Urgent Care (no appt availability in office) / [] Appointment(In office/virtual)/ []  [place] [facility]/ [] Home Care/ [] Refused Recommended Disposition /[] [place] [facility]/ []  Follow-up with PCP ?Additional Notes: Pt. Requesting cough medication be sent to pharmacy. Please advise.  ? ?Answer Assessment - Initial Assessment Questions ?1. ONSET: "When did the cough begin?"  ?    Monday ?2. SEVERITY: "How bad is the cough today?"  ?    Severe ?3. SPUTUM: "Describe the color of your sputum" (none, dry cough; clear, white, yellow, green) ?    None ?4. HEMOPTYSIS: "Are you coughing up any blood?" If so ask: "How much?" (flecks, streaks, tablespoons, etc.) ?    No ?5. DIFFICULTY BREATHING: "Are you having difficulty breathing?" If Yes, ask: "How bad is it?" (e.g., mild, moderate, severe)  ?  - MILD: No SOB at rest, mild SOB with walking, speaks normally in sentences, can lie down, no retractions, pulse < 100.  ?  - MODERATE: SOB at rest, SOB with minimal exertion and prefers to sit, cannot lie down flat, speaks in phrases, mild retractions, audible wheezing, pulse 100-120.  ?  - SEVERE: Very SOB at rest, speaks in single words, struggling to breathe, sitting hunched forward, retractions, pulse > 120  ?    Mild ?6. FEVER: "Do you have a fever?" If Yes, ask: "What is your temperature, how was it measured, and when did it start?" ?    No ?7. CARDIAC HISTORY: "Do you have any history of heart disease?" (e.g., heart attack, congestive heart failure)  ?    No ?8. LUNG HISTORY: "Do you have any history of lung disease?"   (e.g., pulmonary embolus, asthma, emphysema) ?    No ?9. PE RISK FACTORS: "Do you have a history of blood clots?" (or: recent major surgery, recent prolonged travel, bedridden) ?    No ?10. OTHER SYMPTOMS: "Do you have any other symptoms?" (e.g., runny nose, wheezing, chest pain) ?      Wheezing with cough ?11. PREGNANCY: "Is there any chance you are pregnant?" "When was your last menstrual period?" ?      No ?12. TRAVEL: "Have you traveled out of the country in the last month?" (e.g., travel history, exposures) ?      No ? ?Protocols used: Cough - Acute Non-Productive-A-AH ? ?

## 2021-05-02 NOTE — Telephone Encounter (Signed)
Patient is aware of cough medication being sent to [facility] for pick up and that URI panel is still pending

## 2021-05-03 LAB — COVID-19, FLU A+B AND RSV
Influenza A, NAA: NOT DETECTED
Influenza B, NAA: NOT DETECTED
RSV, NAA: NOT DETECTED
SARS-CoV-2, NAA: NOT DETECTED

## 2021-05-07 ENCOUNTER — Telehealth: Payer: Self-pay | Admitting: *Deleted

## 2021-05-07 ENCOUNTER — Ambulatory Visit: Payer: Self-pay | Admitting: *Deleted

## 2021-05-07 NOTE — Telephone Encounter (Signed)
-----   Message from Roney Jaffe, PA-C sent at 05/03/2021  9:13 AM EST ----- ?Please call patient and let her know that her swab showed that she was negative for COVID, flu, and RSV. ?

## 2021-05-07 NOTE — Telephone Encounter (Signed)
Patient verified DOB ?Patient is aware of results and has an appointment with [facility] to address continued chest pain/congestion, hoarseness and SOB. Patient states tessalon pearls help every night. Patient tested negative again this morning for COVID. ?

## 2021-05-07 NOTE — Telephone Encounter (Signed)
Summary: congestion  ? Pt called in stating she was recently seen by the mobile bus and the office, and test neg for Flu/Covid and was giving something but still cant break the congestions, please advise  ?  ? ? ? ?Chief Complaint: cough requesting refill tessalon and anything to help coughing  ?Symptoms: taking deep breath in causes couging spells and worsening cough. Hoarse. Sounds like "rattlling in chest". ?Frequency: since 05/01/21 ?Pertinent Negatives: Patient denies chest pain difficulty breathing. No fever.  ?Disposition: [] ED /[] Urgent Care (no appt availability in office) / [x] Appointment(In office/virtual)/ []  [place] [facility]/ [] Home Care/ [] Refused Recommended Disposition /[] [place] [facility]/ []  Follow-up with PCP ?Additional Notes:  ? ?Scheduled appt 05/09/21. Please advise if tessalon can be refilled and or additional medication be prescribed.  ? ? ? ? ? ?Reason for Disposition ? Cough has been present for > 3 weeks ?   >2 weeks ? ?Answer Assessment - Initial Assessment Questions ?1. ONSET: "When did the cough begin?"  ?    05/01/21 ?2. SEVERITY: "How bad is the cough today?"  ?    Not getting better  ?3. SPUTUM: "Describe the color of your sputum" (none, dry cough; clear, white, yellow, green) ?    na ?4. HEMOPTYSIS: "Are you coughing up any blood?" If so ask: "How much?" (flecks, streaks, tablespoons, etc.) ?    na ?5. DIFFICULTY BREATHING: "Are you having difficulty breathing?" If Yes, ask: "How bad is it?" (e.g., mild, moderate, severe)  ?  - MILD: No SOB at rest, mild SOB with walking, speaks normally in sentences, can lie down, no retractions, pulse < 100.  ?  - MODERATE: SOB at rest, SOB with minimal exertion and prefers to sit, cannot lie down flat, speaks in phrases, mild retractions, audible wheezing, pulse 100-120.  ?  - SEVERE: Very SOB at rest, speaks in single words, struggling to breathe, sitting hunched forward, retractions, pulse > 120  ?    Difficulty taking deep breath in   and starts coughing ?6. FEVER: "Do you have a fever?" If Yes, ask: "What is your temperature, how was it measured, and when did it start?" ?    no ?7. CARDIAC HISTORY: "Do you have any history of heart disease?" (e.g., heart attack, congestive heart failure)  ?    na ?8. LUNG HISTORY: "Do you have any history of lung disease?"  (e.g., pulmonary embolus, asthma, emphysema) ?    na ?9. PE RISK FACTORS: "Do you have a history of blood clots?" (or: recent major surgery, recent prolonged travel, bedridden) ?    na ?10. OTHER SYMPTOMS: "Do you have any other symptoms?" (e.g., runny nose, wheezing, chest pain) ?      Cough/ congestion ?11. PREGNANCY: "Is there any chance you are pregnant?" "When was your last menstrual period?" ?      na ?12. TRAVEL: "Have you traveled out of the country in the last month?" (e.g., travel history, exposures) ?      na ? ?Protocols used: Cough - Acute Non-Productive-A-AH ? ?

## 2021-05-08 NOTE — Telephone Encounter (Signed)
Called pt stated she will keep her appt tomorrow , she is at work today. ?

## 2021-05-09 ENCOUNTER — Ambulatory Visit: Payer: Self-pay | Attending: Physician Assistant | Admitting: Physician Assistant

## 2021-05-09 ENCOUNTER — Other Ambulatory Visit: Payer: Self-pay | Admitting: Pharmacist

## 2021-05-09 ENCOUNTER — Encounter: Payer: Self-pay | Admitting: Physician Assistant

## 2021-05-09 ENCOUNTER — Other Ambulatory Visit: Payer: Self-pay

## 2021-05-09 ENCOUNTER — Ambulatory Visit
Admission: RE | Admit: 2021-05-09 | Discharge: 2021-05-09 | Disposition: A | Discharge status: Home / Self Care | Payer: Self-pay | Source: Ambulatory Visit | Attending: Physician Assistant | Admitting: Physician Assistant

## 2021-05-09 VITALS — BP 100/68 | HR 76 | Resp 18 | Ht 61.0 in | Wt 119.0 lb

## 2021-05-09 DIAGNOSIS — J45909 Unspecified asthma, uncomplicated: Secondary | ICD-10-CM

## 2021-05-09 DIAGNOSIS — J069 Acute upper respiratory infection, unspecified: Secondary | ICD-10-CM

## 2021-05-09 DIAGNOSIS — F411 Generalized anxiety disorder: Secondary | ICD-10-CM

## 2021-05-09 MED ORDER — ALBUTEROL SULFATE HFA 108 (90 BASE) MCG/ACT IN AERS
2.0000 | INHALATION_SPRAY | Freq: Four times a day (QID) | RESPIRATORY_TRACT | 2 refills | Status: DC | PRN
  Filled 2021-05-09: qty 8.5, 25d supply, fill #0

## 2021-05-09 MED ORDER — BUSPIRONE HCL 15 MG PO TABS
15.0000 mg | ORAL_TABLET | Freq: Two times a day (BID) | ORAL | 3 refills | Status: DC
  Filled 2021-05-09: qty 60, 30d supply, fill #0

## 2021-05-09 MED ORDER — FLUTICASONE FUROATE-VILANTEROL 100-25 MCG/ACT IN AEPB
1.0000 | INHALATION_SPRAY | Freq: Every day | RESPIRATORY_TRACT | 2 refills | Status: DC
  Filled 2021-05-09: qty 60, 30d supply, fill #0

## 2021-05-09 MED ORDER — FLUTICASONE-SALMETEROL 250-50 MCG/ACT IN AEPB
1.0000 | INHALATION_SPRAY | Freq: Two times a day (BID) | RESPIRATORY_TRACT | 0 refills | Status: DC
  Filled 2021-05-09: qty 60, 30d supply, fill #0

## 2021-05-09 NOTE — Progress Notes (Signed)
Patient ID: Laura English, female   DOB: 06/03/1980, 41 y.o.   MRN: [id number] ? ? ? ? ?Laura English, is a 41 y.o. female ? ?[id number] ? ?[id number] ? ?DOB - 1980/09/21 ? ?Chief Complaint  ?Patient presents with  ? Cough  ?    ? ?Subjective:  ? ?[name] is a 41 y.o. female here today for a follow up visit Seen by Maurene Capes on mobile visit 05/01/2021 and treated with Tamiflu.  She is feeling better but is continued to cough and she has also been wheezing.  She denies fever.  Does not have any inhalers. ? ?Also c/o anxiety and a lot of stress.  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  When she was using she was diagnosed as having bipolar but she has not been diagnosed/evaluated in long term substance abstinence.  She would like to restart medication mostly to help with anxiety.  She has tried lexapro and didn't notice a difference but also did not take it very long.  She took Buspar but does not remember if it helped.  She is not currently active in 12 step recovery.   ? ? ?From 05/01/2021 visit: ? ?1. Flu-like symptoms ?Rapid COVID test negative, patient was sent to Primary Care at [facility] for flu swab and RSV swab.  Given flulike symptoms, trial Tamiflu, patient education given on supportive care, red flags given for prompt reevaluation. ?- oseltamivir (TAMIFLU) 75 MG capsule; Take 1 capsule (75 mg total) by mouth 2 (two) times daily for 5 days.  Dispense: 10 capsule; Refill: 0 ?- COVID-19, Flu A+B and RSV ?  ?2. Cough, unspecified type ?  ?- POC COVID-19 ? ?No problems updated. ? ?ALLERGIES: ?No Known Allergies ? ?PAST MEDICAL HISTORY: ?Past Medical History:  ?Diagnosis Date  ? Depression   ? hx depression - no meds - no current problems  ? Heartburn in pregnancy   ? on pepcid  ? No pertinent past medical history   ? ? ?MEDICATIONS AT HOME: ?Prior to Admission medications   ?Medication Sig Start Date End Date Taking? Authorizing Provider  ?albuterol (VENTOLIN HFA) 108 (90  Base) MCG/ACT inhaler Inhale 2 puffs into the lungs every 6 (six) hours as needed for wheezing or shortness of breath. 05/09/21  Yes Anders Simmonds, PA-C  ?benzonatate (TESSALON) 100 MG capsule Take 2 capsules (200 mg total) by mouth 2 (two) times daily as needed for cough. 05/02/21  Yes Mayers, Cari S, PA-C  ?busPIRone (BUSPAR) 15 MG tablet Take 1 tablet (15 mg total) by mouth 2 (two) times daily. 05/09/21  Yes Georgian Co M, PA-C  ?fluticasone furoate-vilanterol (BREO ELLIPTA) 100-25 MCG/ACT AEPB Inhale 1 puff into the lungs daily. 05/09/21   Hoy Register, MD  ?omeprazole (PRILOSEC) 40 MG capsule Take 1 capsule (40 mg total) by mouth daily. ?Patient not taking: Reported on 05/09/2021 12/06/20   Hoy Register, MD  ? ? ?ROS: ?Neg HEENT ?Neg cardiac ?Neg GI ?Neg GU ?Neg MS ?Neg psych ?Neg neuro ? ?Objective:  ? ?Vitals:  ? 05/09/21 1056  ?BP: 100/68  ?Pulse: 76  ?Resp: 18  ?SpO2: 97%  ?Weight: 119 lb (54 kg)  ?Height: 5\' 1"  (1.549 m)  ? ?Exam ?General appearance : Awake, alert, not in any distress. Speech Clear. Not toxic looking ?HEENT: Atraumatic and Normocephalic ?Neck: Supple, no JVD. No cervical lymphadenopathy.  ?Chest: Good air entry bilaterally, CTAB.  No rales/rhonchi.  Hter is diffuse moderate wheezing thorughout ?CVS: S1 S2 regular, no murmurs.  ?  Extremities: B/L Lower Ext shows no edema, both legs are warm to touch ?Neurology: Awake alert, and oriented X 3, CN II-XII intact, Non focal ?Skin: No Rash ? ?Data Review ?No results found for: HGBA1C ? ?Assessment & Plan  ? ?1. GAD (generalized anxiety disorder) ?Advised resuming 12 step recovery as this helps manage stress and anxiety as well as well as provides relapse prevention.  Also counseled on self-care, relaxation techniques ?- busPIRone (BUSPAR) 15 MG tablet; Take 1 tablet (15 mg total) by mouth 2 (two) times daily.  Dispense: 60 tablet; Refill: 3 ? ?2. Upper respiratory tract infection, unspecified type ?I do not think she has an infectious  etiology at this time but rather reactive airways ?- DG Chest 2 View; Future ? ?3. Reactive airway disease without complication, unspecified asthma severity, unspecified whether persistent ?- albuterol (VENTOLIN HFA) 108 (90 Base) MCG/ACT inhaler; Inhale 2 puffs into the lungs every 6 (six) hours as needed for wheezing or shortness of breath.  Dispense: 8 g; Refill: 2 ?- DG Chest 2 View; Future ? ? ? ?Patient have been counseled extensively about nutrition and exercise. Other issues discussed during this visit include: low cholesterol diet, weight control and daily exercise, foot care, annual eye examinations at Ophthalmology, importance of adherence with medications and regular follow-up. We also discussed long term complications of uncontrolled diabetes and hypertension.  ? ?Return in about 6 weeks (around 06/20/2021) for assign PCP-recheck cough and starting buspar. ? ?The patient was given clear instructions to go to ER or return to medical center if symptoms don't improve, worsen or new problems develop. The patient verbalized understanding. The patient was told to call to get lab results if they haven't heard anything in the next week.  ? ? ? ? ?Georgian Co, PA-C ?[facility] ?[place], [place] ?[phone]   ?05/09/2021, 12:47 PM  ?  ?

## 2021-05-10 ENCOUNTER — Other Ambulatory Visit: Payer: Self-pay

## 2021-05-22 ENCOUNTER — Other Ambulatory Visit: Payer: Self-pay

## 2021-05-28 ENCOUNTER — Other Ambulatory Visit: Payer: Self-pay

## 2021-06-20 ENCOUNTER — Ambulatory Visit: Payer: Self-pay | Admitting: Physician Assistant

## 2021-07-24 ENCOUNTER — Other Ambulatory Visit: Payer: Self-pay

## 2022-03-02 IMAGING — CT CT ABD-PELV W/ CM
2 of 4 series · 16 of 46 positions shown, 18 images · IV contrast (OMNIPAQUE 350)
Comparison: No priors.

CLINICAL DATA: 39-year-old female with history of left lower
quadrant abdominal pain and pelvic pain with increased bleeding
during this menstrual.

EXAM:
CT ABDOMEN AND PELVIS WITH CONTRAST
TECHNIQUE: Multidetector CT imaging of the abdomen and pelvis was performed
using the standard protocol following bolus administration of
intravenous contrast.
CONTRAST:  80mL OMNIPAQUE IOHEXOL 350 MG/ML SOLN

[Series 2: axial st · axial · 0.68mm/px · z∈[+1046,+1420]mm · 13 of 85 slices shown, 15 images]
[im 5/85  soft-tissue]
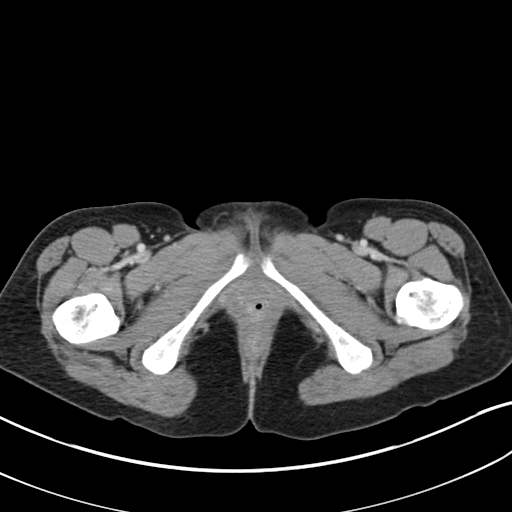
[im 5/85  bone]
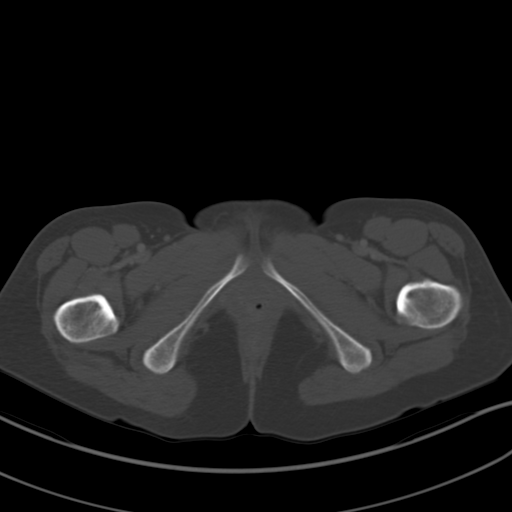
[im 10/85  soft-tissue]
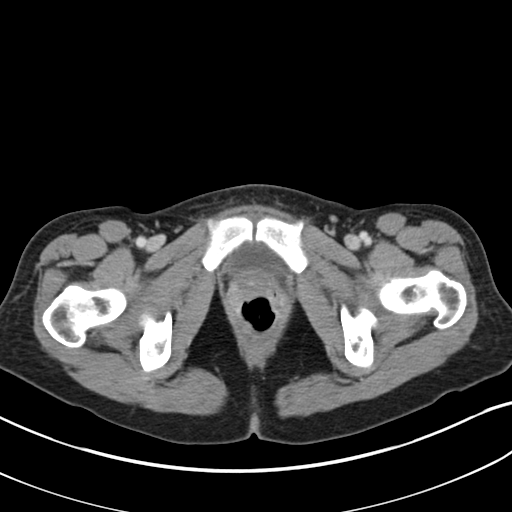
[im 19/85  soft-tissue]
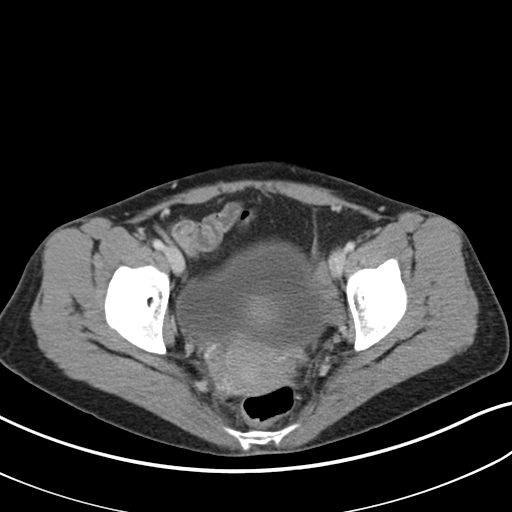
[im 24/85  soft-tissue]
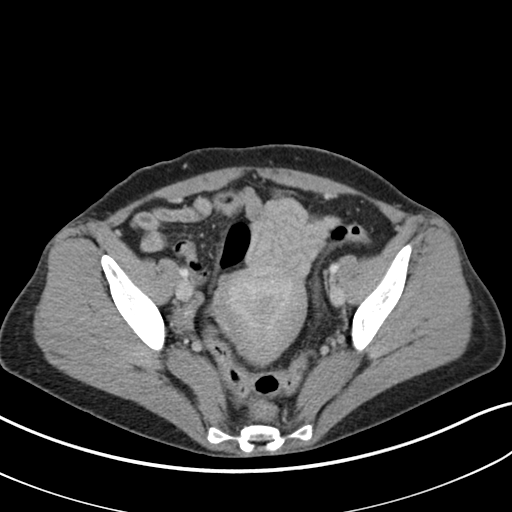
[im 29/85  soft-tissue]
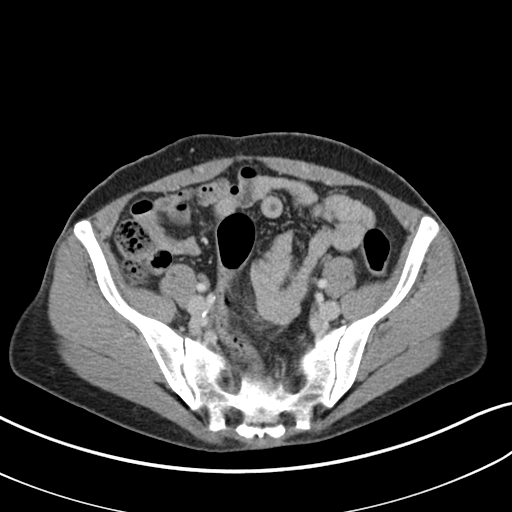
[im 38/85  soft-tissue]
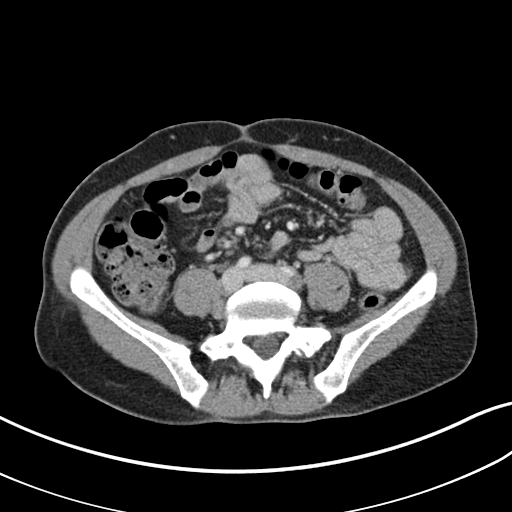
[im 43/85  soft-tissue]
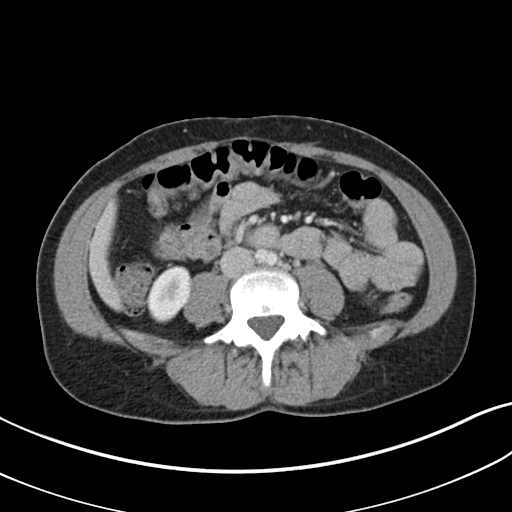
[im 47/85  soft-tissue]
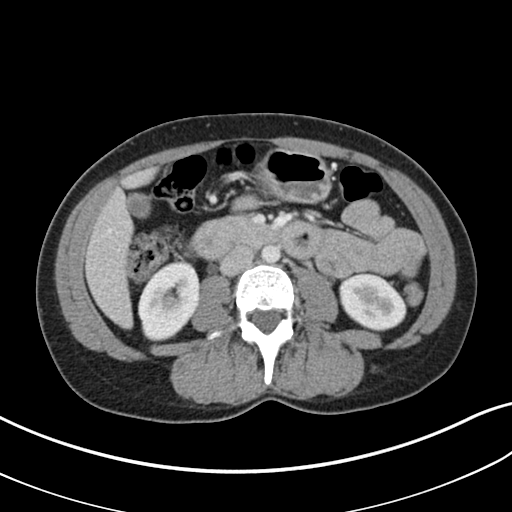
[im 57/85  soft-tissue]
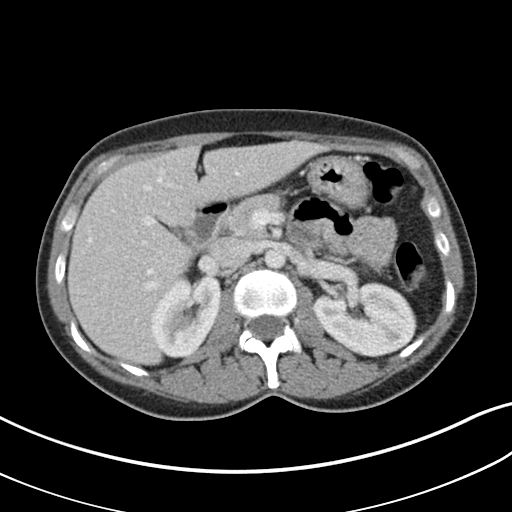
[im 57/85  bone]
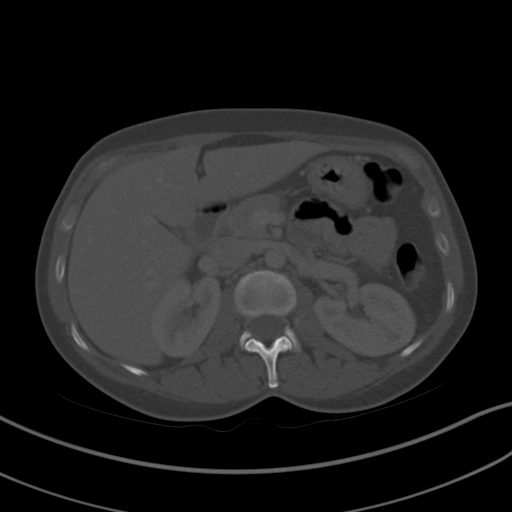
[im 61/85  soft-tissue]
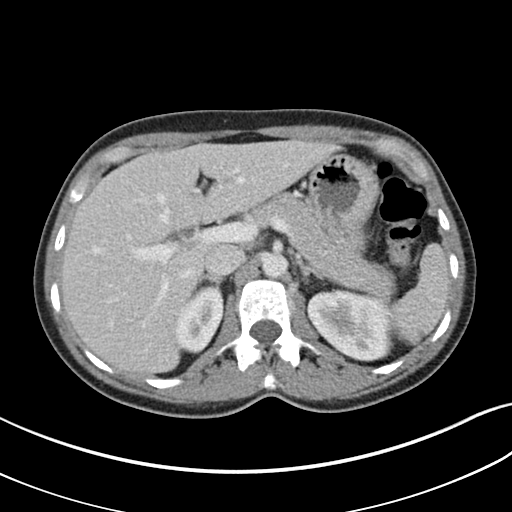
[im 66/85  soft-tissue]
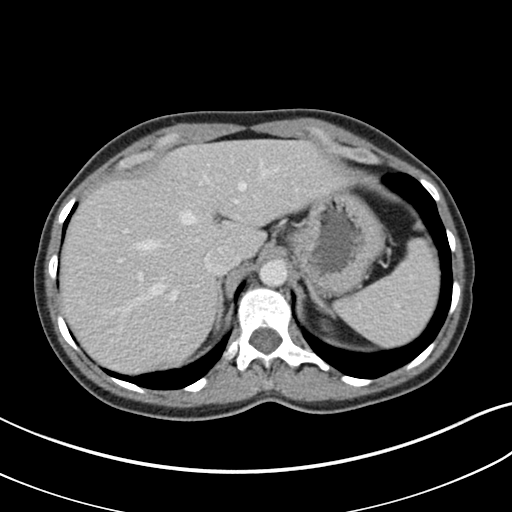
[im 75/85  soft-tissue]
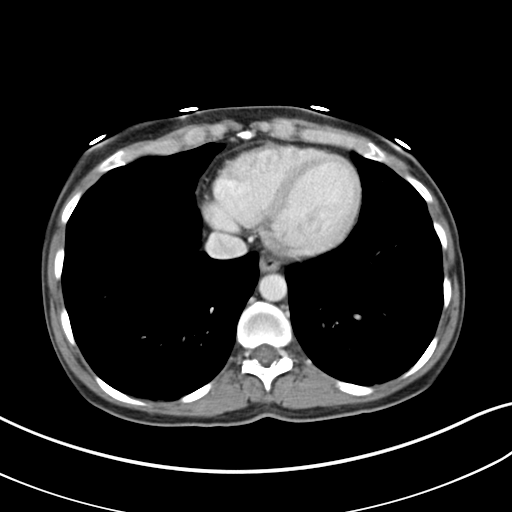
[im 80/85  soft-tissue]
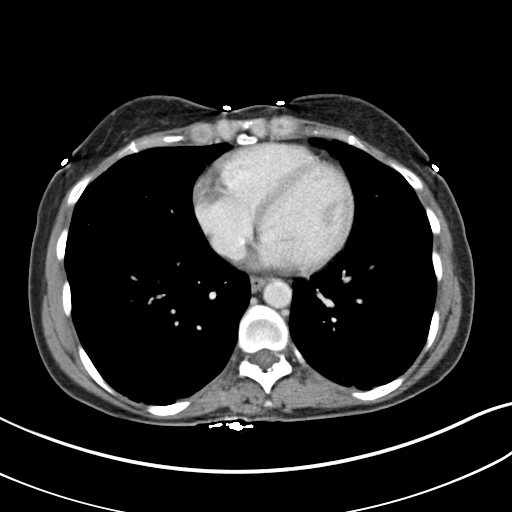

[Series 4: coronal st · coronal · 0.60mm/px · 3 of 119 slices shown]
[im 40/119  soft-tissue]
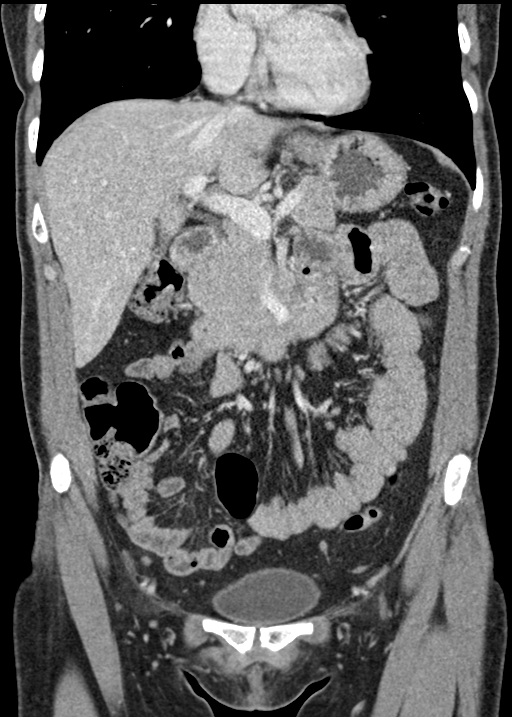
[im 53/119  soft-tissue]
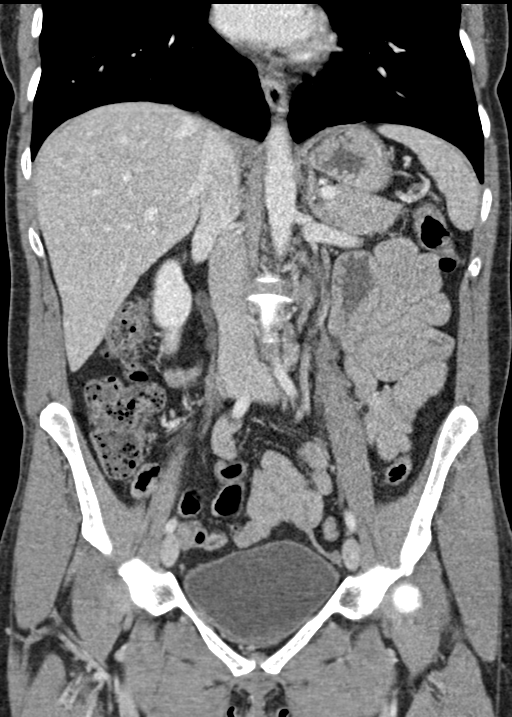
[im 66/119  soft-tissue]
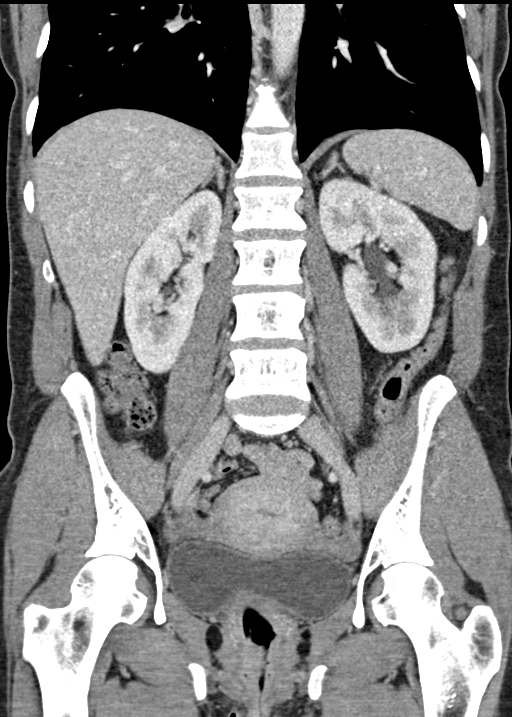

[16 of 46 positions shown; findings below may reference images not displayed]

FINDINGS: Lower chest: Unremarkable.

Hepatobiliary: No suspicious cystic or solid hepatic lesions. No
intra or extrahepatic biliary ductal dilatation. Gallbladder is
normal in appearance.

Pancreas: No pancreatic mass. No pancreatic ductal dilatation. No
pancreatic or peripancreatic fluid collections or inflammatory
changes.

Spleen: Unremarkable.

Adrenals/Urinary Tract: Bilateral kidneys and bilateral adrenal
glands are normal in appearance. No hydroureteronephrosis. Urinary
bladder is normal in appearance.

Stomach/Bowel: The appearance of the stomach is normal. There is no
pathologic dilatation of small bowel or colon. Normal appendix.

Vascular/Lymphatic: Atherosclerosis in the pelvic vasculature. No
aneurysm or dissection noted in the abdominal or pelvic vasculature.
No lymphadenopathy identified in the abdomen or pelvis.

Reproductive: Uterus and ovaries are unremarkable in appearance.
Tampon present in the vagina.

Other: No significant volume of ascites.  No pneumoperitoneum.

Musculoskeletal: There are no aggressive appearing lytic or blastic
lesions noted in the visualized portions of the skeleton.
IMPRESSION: 1. No acute findings are noted in the abdomen or pelvis.
2. Mild atherosclerosis in the pelvic vasculature.

## 2022-08-08 IMAGING — CR DG CHEST 2V
2 series · 2 of 2 positions shown · non-contrast
Comparison: None.

CLINICAL DATA: Cough and low-grade fever.

EXAM:
CHEST - 2 VIEW

[w chest pa]
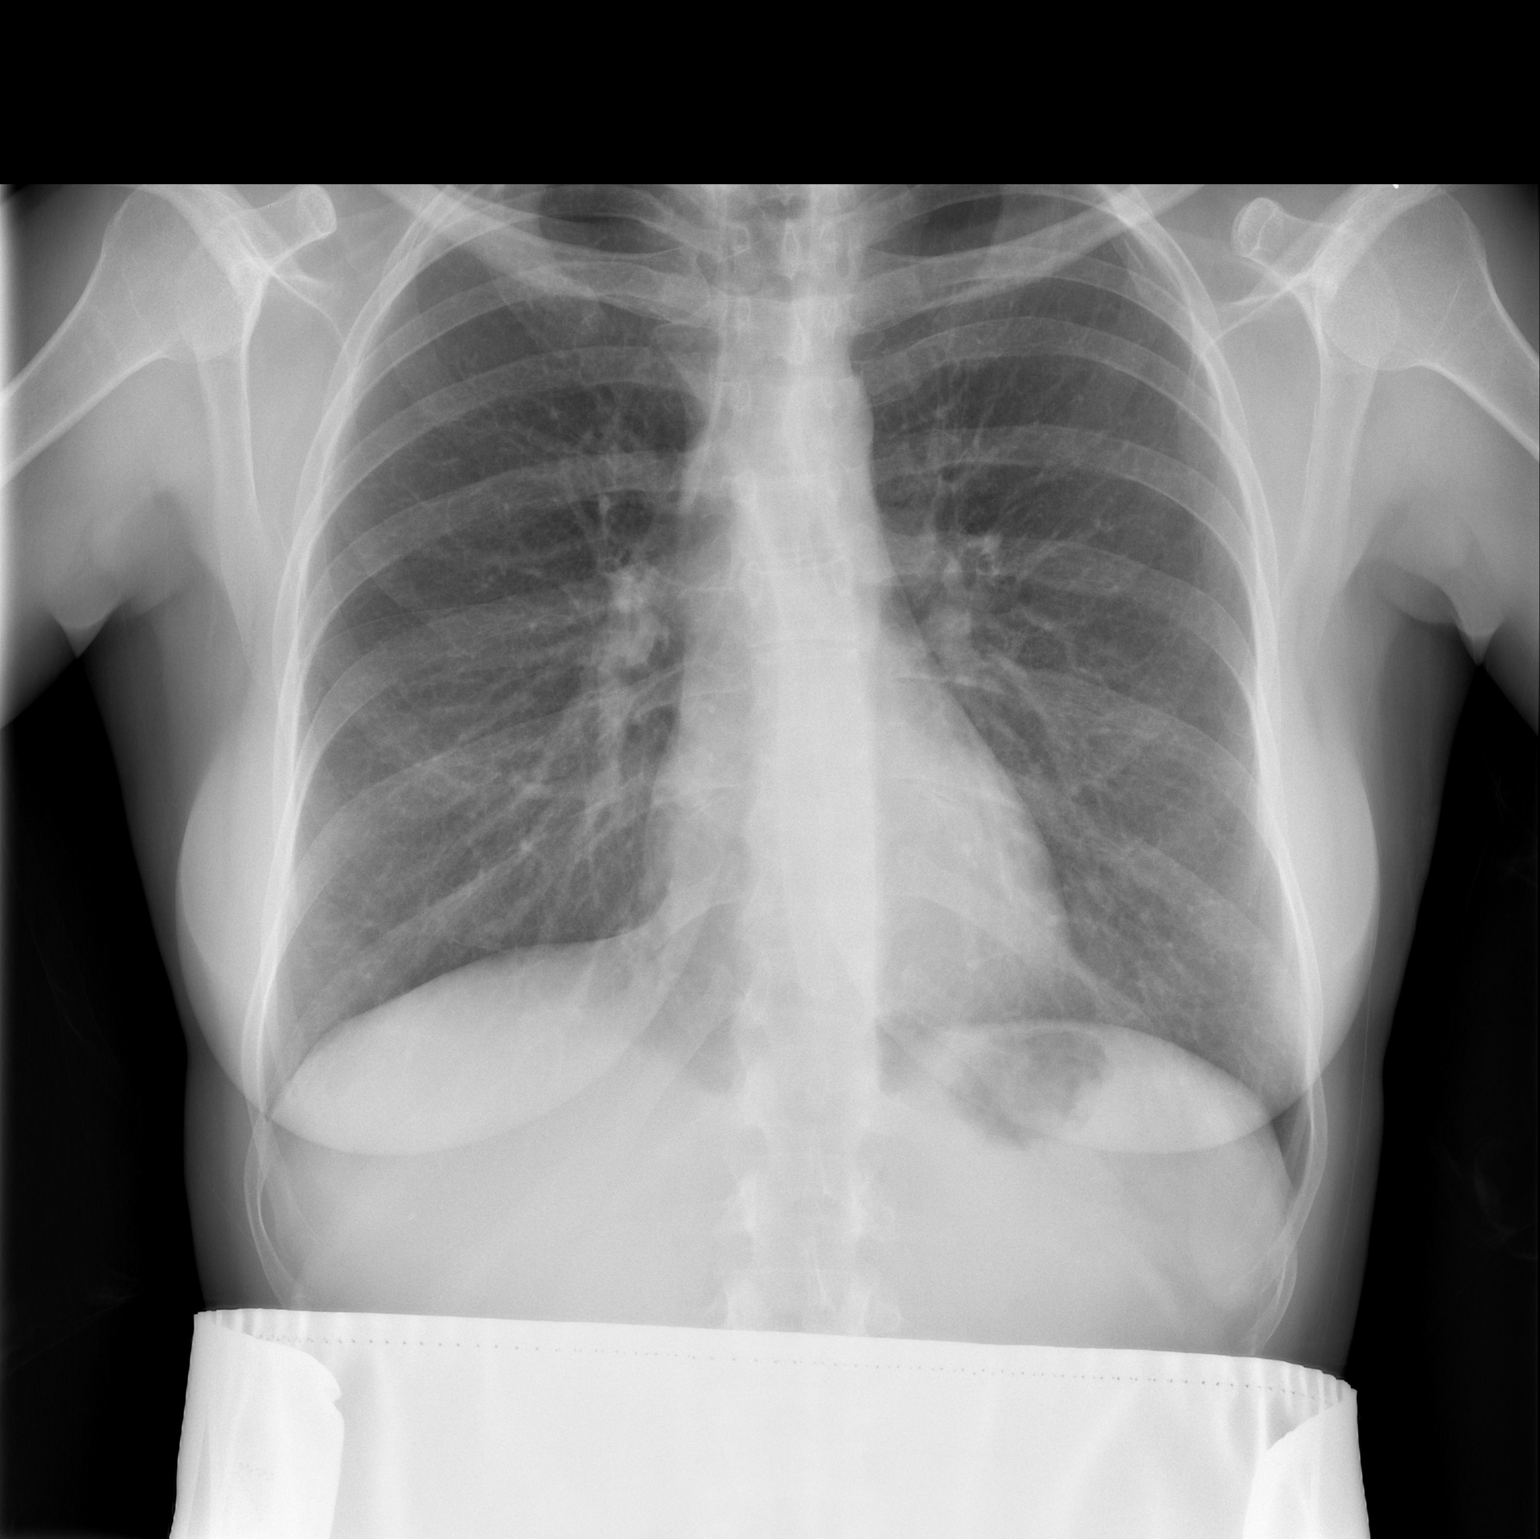

[w chest lat]
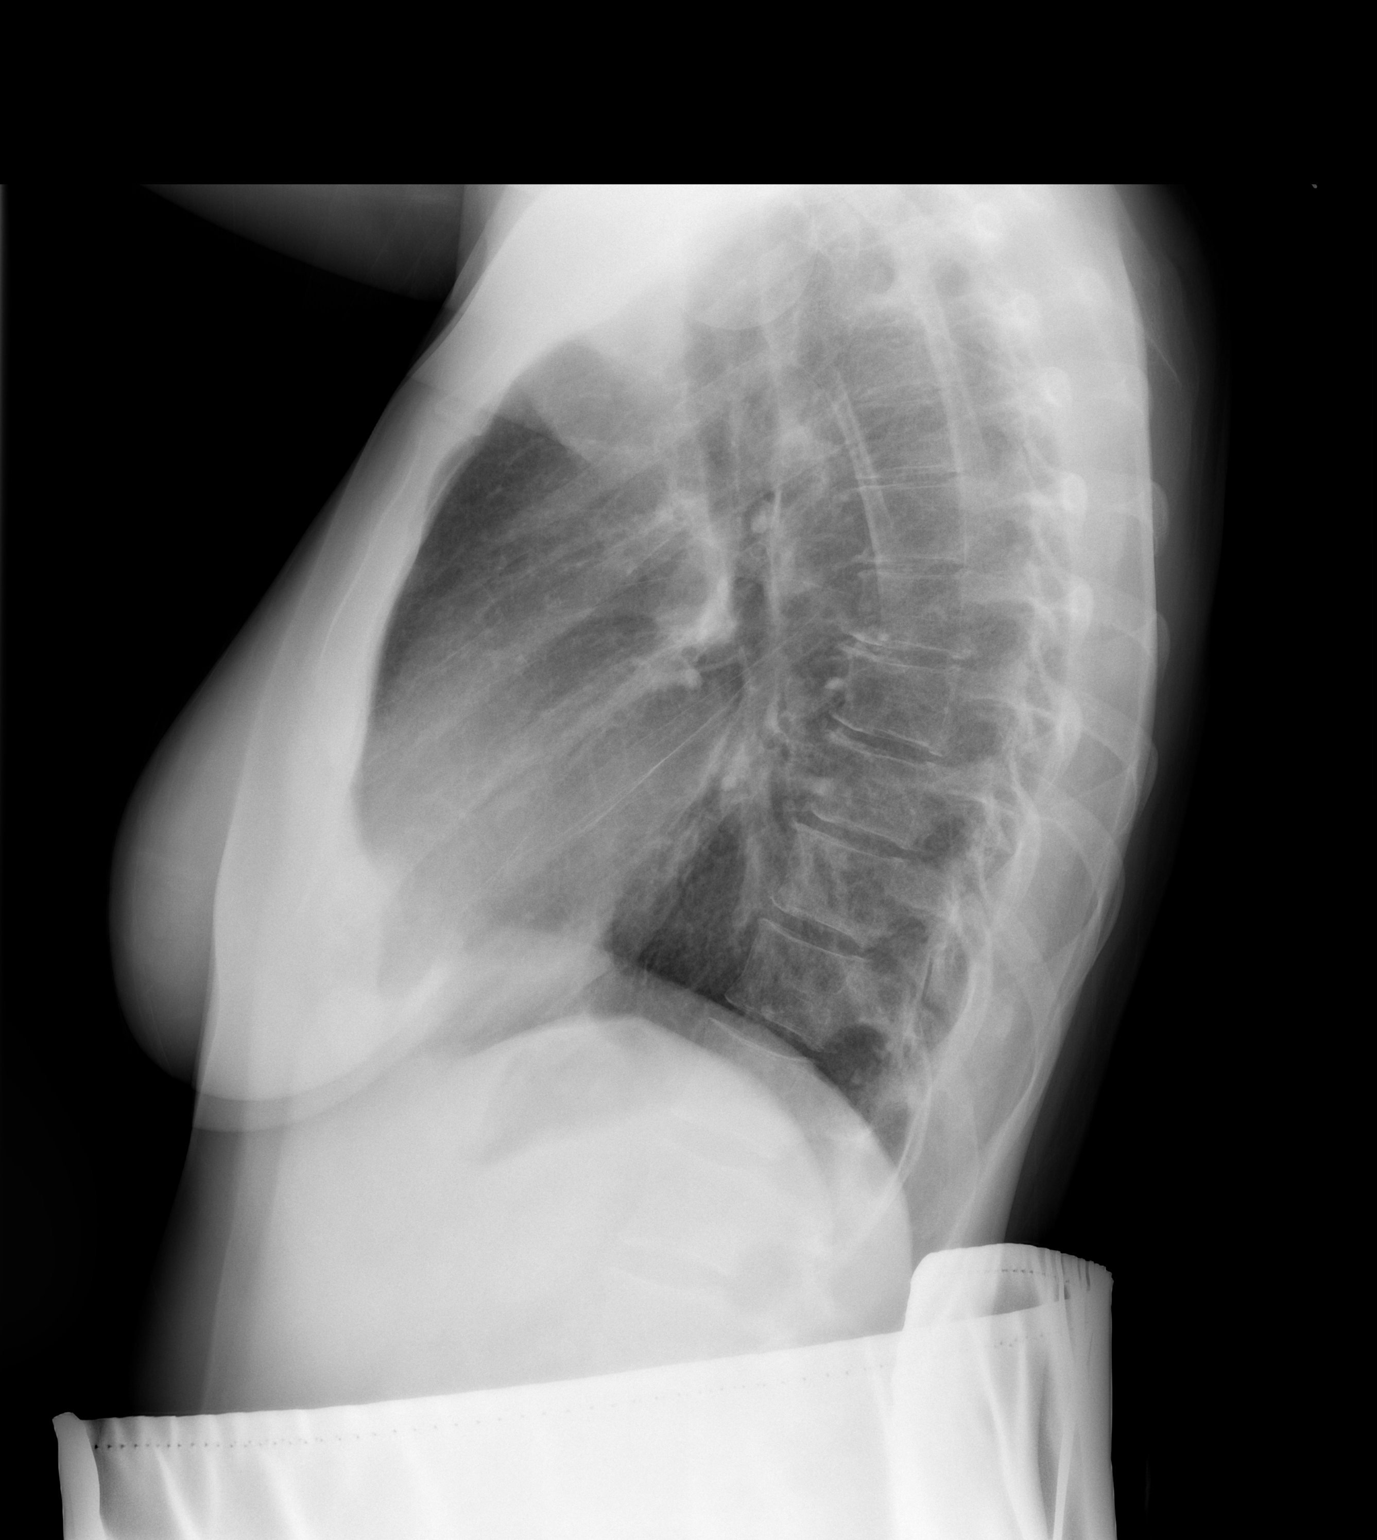

[2 of 2 positions shown; findings below may reference images not displayed]

FINDINGS: The heart size and mediastinal contours are within normal limits.
Both lungs are clear. The visualized skeletal structures are
unremarkable.
IMPRESSION: No active cardiopulmonary disease.

## 2022-09-10 DIAGNOSIS — M79644 Pain in right finger(s): Secondary | ICD-10-CM | POA: Diagnosis not present

## 2022-10-03 DIAGNOSIS — G5621 Lesion of ulnar nerve, right upper limb: Secondary | ICD-10-CM | POA: Diagnosis not present

## 2022-12-25 ENCOUNTER — Other Ambulatory Visit (HOSPITAL_COMMUNITY)
Admission: RE | Admit: 2022-12-25 | Discharge: 2022-12-25 | Disposition: A | Discharge status: Home / Self Care | Payer: BC Managed Care – PPO | Source: Ambulatory Visit | Attending: Physician Assistant | Admitting: Physician Assistant

## 2022-12-25 ENCOUNTER — Other Ambulatory Visit: Payer: Self-pay

## 2022-12-25 ENCOUNTER — Ambulatory Visit: Payer: BC Managed Care – PPO | Attending: Physician Assistant | Admitting: Physician Assistant

## 2022-12-25 VITALS — BP 114/75 | HR 88 | Temp 98.4°F | Ht 61.0 in | Wt 113.2 lb

## 2022-12-25 DIAGNOSIS — Z124 Encounter for screening for malignant neoplasm of cervix: Secondary | ICD-10-CM | POA: Diagnosis not present

## 2022-12-25 DIAGNOSIS — N898 Other specified noninflammatory disorders of vagina: Secondary | ICD-10-CM | POA: Insufficient documentation

## 2022-12-25 DIAGNOSIS — Z202 Contact with and (suspected) exposure to infections with a predominantly sexual mode of transmission: Secondary | ICD-10-CM | POA: Diagnosis not present

## 2022-12-25 DIAGNOSIS — F411 Generalized anxiety disorder: Secondary | ICD-10-CM

## 2022-12-25 MED ORDER — BUSPIRONE HCL 15 MG PO TABS
15.0000 mg | ORAL_TABLET | Freq: Two times a day (BID) | ORAL | 3 refills | Status: DC
  Filled 2022-12-25: qty 60, 30d supply, fill #0

## 2022-12-25 NOTE — Progress Notes (Signed)
Patient ID: Laura English, female   DOB: September 22, 1980, 42 y.o.   MRN: [id number]   Laura English, is a 42 y.o. female  [id number]  [id number]  DOB - 1980/04/20  Chief Complaint  Patient presents with   Abnormal Pap Smear   Vaginal Discharge       Subjective:   Laura English is a 42 y.o. female here today for pap and STD testing.  She found out her BF has been sleeping with her best friend and she knows her best friend has a lot of partners and has done sex work.  She has noticed a difference in vaginal discharge in the past couple months.  No pelvic pain except with periods.  She denies any fevers or recent illness.    She wants to get back on buspar bc her anxiety is "through the roof."  Buspar has helped.    No problems updated.  ALLERGIES: No Known Allergies  PAST MEDICAL HISTORY: Past Medical History:  Diagnosis Date   Depression    hx depression - no meds - no current problems   Heartburn in pregnancy    on pepcid   No pertinent past medical history     MEDICATIONS AT HOME: Prior to Admission medications   Medication Sig Start Date End Date Taking? Authorizing Provider  albuterol (VENTOLIN HFA) 108 (90 Base) MCG/ACT inhaler Inhale 2 puffs into the lungs every 6 (six) hours as needed for wheezing or shortness of breath. 05/09/21   Anders Simmonds, PA-C  benzonatate (TESSALON) 100 MG capsule Take 2 capsules (200 mg total) by mouth 2 (two) times daily as needed for cough. 05/02/21   Mayers, Cari S, PA-C  busPIRone (BUSPAR) 15 MG tablet Take 1 tablet (15 mg total) by mouth 2 (two) times daily. 12/25/22   Anders Simmonds, PA-C  fluticasone furoate-vilanterol (BREO ELLIPTA) 100-25 MCG/ACT AEPB Inhale 1 puff into the lungs daily. 05/09/21   Hoy Register, MD  omeprazole (PRILOSEC) 40 MG capsule Take 1 capsule (40 mg total) by mouth daily. Patient not taking: Reported on 05/09/2021 12/06/20   Hoy Register, MD    ROS: Neg HEENT Neg resp Neg  cardiac Neg GI Neg MS Neg psych Neg neuro  Objective:   Vitals:   12/25/22 1130  BP: 114/75  Pulse: 88  Temp: 98.4 F (36.9 C)  SpO2: 99%  Weight: 113 lb 3.2 oz (51.3 kg)  Height: 5\' 1"  (1.549 m)   Exam General appearance : Awake, alert, not in any distress. Speech Clear. Not toxic looking; anxious affect HEENT: Atraumatic and Normocephalic Neck: Supple, no JVD. No cervical lymphadenopathy.  Chest: Good air entry bilaterally, CTAB.  No rales/rhonchi/wheezing CVS: S1 S2 regular, no murmurs.  Pelvic:  speculum inserted and cervix wnl.  Some scant discharge. Pap and and cervical swab taken.  Bimanual unremarkable Extremities: B/L Lower Ext shows no edema, both legs are warm to touch Neurology: Awake alert, and oriented X 3, CN II-XII intact, Non focal Skin: No Rash  Data Review No results found for: "HGBA1C"  Assessment & Plan   1. GAD (generalized anxiety disorder) Resume buspar - busPIRone (BUSPAR) 15 MG tablet; Take 1 tablet (15 mg total) by mouth 2 (two) times daily.  Dispense: 60 tablet; Refill: 3  2. STD exposure Safe sex/condoms recommended - HIV antibody (with reflex) - Cervicovaginal ancillary only - RPR  3. Vaginal discharge - Cervicovaginal ancillary only  4. Screening for cervical cancer - Cytology - PAP([place])    Return  for please schedule physical with her PCp([place]).  The patient was given clear instructions to go to ER or return to medical center if symptoms don't improve, worsen or new problems develop. The patient verbalized understanding. The patient was told to call to get lab results if they haven't heard anything in the next week.      Georgian Co, PA-C [facility] and [facility] [place], [place] [phone]   12/25/2022, 12:12 PM

## 2022-12-26 ENCOUNTER — Ambulatory Visit: Payer: Self-pay | Admitting: Physician Assistant

## 2022-12-26 LAB — CERVICOVAGINAL ANCILLARY ONLY
Bacterial Vaginitis (gardnerella): NEGATIVE
Candida Glabrata: POSITIVE — AB
Candida Vaginitis: POSITIVE — AB
Chlamydia: NEGATIVE
Comment: NEGATIVE
Comment: NEGATIVE
Comment: NEGATIVE
Comment: NEGATIVE
Comment: NEGATIVE
Comment: NORMAL
Neisseria Gonorrhea: NEGATIVE
Trichomonas: POSITIVE — AB

## 2022-12-26 LAB — RPR: RPR Ser Ql: NONREACTIVE

## 2022-12-26 LAB — HIV ANTIBODY (ROUTINE TESTING W REFLEX): HIV Screen 4th Generation wRfx: NONREACTIVE

## 2022-12-27 ENCOUNTER — Other Ambulatory Visit: Payer: Self-pay

## 2022-12-27 ENCOUNTER — Other Ambulatory Visit: Payer: Self-pay | Admitting: Physician Assistant

## 2022-12-27 ENCOUNTER — Telehealth: Payer: Self-pay

## 2022-12-27 DIAGNOSIS — B3731 Acute candidiasis of vulva and vagina: Secondary | ICD-10-CM

## 2022-12-27 DIAGNOSIS — A5909 Other urogenital trichomoniasis: Secondary | ICD-10-CM

## 2022-12-27 MED ORDER — METRONIDAZOLE 500 MG PO TABS
500.0000 mg | ORAL_TABLET | Freq: Two times a day (BID) | ORAL | 0 refills | Status: DC
  Filled 2022-12-27: qty 14, 7d supply, fill #0

## 2022-12-27 MED ORDER — FLUCONAZOLE 150 MG PO TABS
150.0000 mg | ORAL_TABLET | Freq: Once | ORAL | 0 refills | Status: AC
  Filled 2022-12-27: qty 1, 1d supply, fill #0

## 2022-12-27 NOTE — Telephone Encounter (Signed)
-----   Message from Georgian Co sent at 12/26/2022  8:57 AM EDT ----- Your HIV and syphilis testing are negative.  I will let you know when cervical swab and pap smearare back.  Thanks, Georgian Co, PA-C

## 2022-12-27 NOTE — Telephone Encounter (Signed)
Patient viewed results and Doctor comment through [facility]

## 2022-12-30 ENCOUNTER — Ambulatory Visit: Payer: Self-pay | Admitting: *Deleted

## 2022-12-30 ENCOUNTER — Telehealth: Payer: Self-pay

## 2022-12-30 LAB — CYTOLOGY - PAP
Comment: NEGATIVE
Diagnosis: UNDETERMINED — AB
High risk HPV: POSITIVE — AB

## 2022-12-30 NOTE — Telephone Encounter (Signed)
Patient called in to get results from her recent pap smear. Advised patient that the provider has not made comments the the pap smear results yet due to the results just becoming available today. Patient states she is very anxious about the results and would like providers comments as soon as possible. Advised I would forward message to provider.

## 2022-12-30 NOTE — Telephone Encounter (Signed)
-----   Message from Georgian Co sent at 12/27/2022 11:22 AM EDT ----- You tested positive for trichomonas which IS a sexually transmitted disease.  Please notify any partners.  You also have bacterial vaginitis and yeast infection.  I have sent you medications that will take care of the trich and BV(metronidazole)  and yeast(diflucan).  Your swabs were negative for gonorrhea and chlamydia.  Pap smear is not back yet.  Thanks, Georgian Co, PA-C

## 2022-12-30 NOTE — Telephone Encounter (Signed)
  Chief Complaint: patient requesting pap smear result  Disposition: [] ED /[] Urgent Care (no appt availability in office) / [] Appointment(In office/virtual)/ []  [place] [facility]/ [] Home Care/ [] Refused Recommended Disposition /[] [place] [facility]/ [x]  Follow-up with PCP Additional Notes: Provider has not made recommendations yet- forwarded to office

## 2022-12-30 NOTE — Telephone Encounter (Signed)
Awaiting providers recommendation re: PAP Smear

## 2022-12-30 NOTE — Telephone Encounter (Signed)
Summary: pap smear results   Pt called wanting to talk to a nurse about her pap smear results.  CB@  [phone]         Patient is calling to discuss pap smear results- no recommendations on results- request forwarded for office review and call back.  Reason for Disposition  Caller requesting lab results  (Exception: Routine or non-urgent lab result.)  Answer Assessment - Initial Assessment Questions 1. REASON FOR CALL or QUESTION: "What is your reason for calling today?" or "How can I best help you?" or "What question do you have that I can help answer?"     Pap smear result requested  Protocols used: Information Only Call - No Triage-A-AH, PCP Call - No Triage-A-AH

## 2022-12-30 NOTE — Telephone Encounter (Signed)
Patient viewed results and Doctor comment through [facility]

## 2022-12-31 ENCOUNTER — Ambulatory Visit: Payer: Self-pay | Admitting: *Deleted

## 2022-12-31 NOTE — Telephone Encounter (Signed)
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  Answer Assessment - Initial Assessment Questions 1. REASON FOR CALL or QUESTION: "What is your reason for calling today?" or "How can I best help you?" or "What question do you have that I can help answer?"     Pt saw her PAP smear results on MyChart.   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   Does this need treating?  Can it be spread to others? Please call pt and clarify results.   She would like an answer an soon as possible due to a situation she is in.      I let her know I would send a high priority message to Georgian Co, PA-C and have someone call her back.  Protocols used: Information Only Call - No Triage-A-AH

## 2022-12-31 NOTE — Telephone Encounter (Addendum)
  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   She saw the message from Georgian Co, PA-C dated 12/27/2022 at 11:22 AM.    Does this need to be treated?   Can it be spread to others?   Please call pt as she is in a situation where she needs answers as soon as possible. Symptoms: N/A Frequency: N/A Pertinent Negatives: Patient denies N/A Disposition: [] ED /[] Urgent Care (no appt availability in office) / [] Appointment(In office/virtual)/ []  [place] Virtual [facility]/ [] Home Care/ [] Refused Recommended Disposition /[] [place] [facility]/ [x]  Follow-up with PCP Additional Notes: Please call pt with answers as soon as possible.    High priority message sent to [facility], PA-C.   Pt agreeable to someone calling her back preferably today if possible.      She is taking the prescribed medications that were called in for the other issues.

## 2022-12-31 NOTE — Telephone Encounter (Signed)
Spoke with patient . Advised that PAP smear has not been interpreter by the provider. Advised as soon as it's  done we will relay the providers recommendation to her. patient voiced understanding but ask the she be call as soon as possible.

## 2023-01-01 ENCOUNTER — Other Ambulatory Visit: Payer: Self-pay | Admitting: Physician Assistant

## 2023-01-01 ENCOUNTER — Other Ambulatory Visit: Payer: Self-pay

## 2023-01-01 DIAGNOSIS — B977 Papillomavirus as the cause of diseases classified elsewhere: Secondary | ICD-10-CM

## 2023-01-01 DIAGNOSIS — R87610 Atypical squamous cells of undetermined significance on cytologic smear of cervix (ASC-US): Secondary | ICD-10-CM

## 2023-01-01 MED ORDER — FLUCONAZOLE 150 MG PO TABS
150.0000 mg | ORAL_TABLET | Freq: Once | ORAL | 0 refills | Status: AC
  Filled 2023-01-01: qty 1, 1d supply, fill #0

## 2023-01-02 ENCOUNTER — Other Ambulatory Visit: Payer: Self-pay

## 2023-01-03 ENCOUNTER — Telehealth: Payer: Self-pay

## 2023-01-03 NOTE — Telephone Encounter (Signed)
-----   Message from Georgian Co sent at 01/01/2023  8:53 AM EST ----- Your pap smear was mildly abnormal.  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  This DOES NOT MEAN YOU HAVE CANCER.  The pap smear is specifically designed to catch this so it does not progress and you will be followed more closely than a patient with a normal pap smear.  I am referring you to gynecology so they can follow this closely.  There was also yeast infection, so I sent you a prescription for diflucan for that.  Thanks, Georgian Co, PA-C

## 2023-01-03 NOTE — Telephone Encounter (Signed)
Patient viewed results and Doctor comment through [facility]

## 2023-01-20 ENCOUNTER — Other Ambulatory Visit: Payer: Self-pay

## 2023-01-20 ENCOUNTER — Ambulatory Visit: Payer: BC Managed Care – PPO | Attending: Family Medicine | Admitting: Family Medicine

## 2023-01-20 ENCOUNTER — Encounter: Payer: Self-pay | Admitting: Family Medicine

## 2023-01-20 VITALS — BP 101/62 | HR 71 | Ht 61.0 in | Wt 112.8 lb

## 2023-01-20 DIAGNOSIS — Z13228 Encounter for screening for other metabolic disorders: Secondary | ICD-10-CM | POA: Diagnosis not present

## 2023-01-20 DIAGNOSIS — R202 Paresthesia of skin: Secondary | ICD-10-CM | POA: Diagnosis not present

## 2023-01-20 DIAGNOSIS — Z23 Encounter for immunization: Secondary | ICD-10-CM

## 2023-01-20 DIAGNOSIS — F3160 Bipolar disorder, current episode mixed, unspecified: Secondary | ICD-10-CM

## 2023-01-20 DIAGNOSIS — Z131 Encounter for screening for diabetes mellitus: Secondary | ICD-10-CM | POA: Diagnosis not present

## 2023-01-20 MED ORDER — DIVALPROEX SODIUM ER 500 MG PO TB24
500.0000 mg | ORAL_TABLET | Freq: Every day | ORAL | 1 refills | Status: DC
  Filled 2023-01-20: qty 30, 30d supply, fill #0
  Filled 2023-02-28 (×2): qty 30, 30d supply, fill #1

## 2023-01-20 NOTE — Patient Instructions (Signed)
Divalproex Sodium Delayed- or Extended-Release Tablets What is this medication? DIVALPROEX SODIUM (dye VAL pro ex SO dee um) prevents and controls seizures in people with epilepsy. It may also be used to prevent migraine headaches. It can also be used to treat bipolar disorder. It works by calming overactive nerves in your body. This medicine may be used for other purposes; ask your health care provider or pharmacist if you have questions. COMMON BRAND NAME(S): Depakote, Depakote ER What should I tell my care team before I take this medication? They need to know if you have any of these conditions: Frequently drink alcohol Kidney disease Liver disease Low platelet counts Mitochondrial disease Suicidal thoughts, plans, or attempt by you or a family member Urea cycle disorder (UCD) An unusual or allergic reaction to divalproex sodium, sodium valproate, valproic acid, other medications, foods, dyes, or preservatives Pregnant or trying to get pregnant Breast-feeding How should I use this medication? Take this medication by mouth with a drink of water. Follow the directions on the prescription label. Do not cut, crush or chew this medication. You can take it with or without food. If it upsets your stomach, take it with food. Take your medication at regular intervals. Do not take it more often than directed. Do not stop taking except on your care team's advice. A special MedGuide will be given to you by the pharmacist with each prescription and refill. Be sure to read this information carefully each time. Talk to your care team about the use of this medication in children. While this medication may be prescribed for children as young as 10 years for selected conditions, precautions do apply. Overdosage: If you think you have taken too much of this medicine contact a poison control center or emergency room at once. NOTE: This medicine is only for you. Do not share this medicine with others. What if I  miss a dose? If you miss a dose, take it as soon as you can. If it is almost time for your next dose, take only that dose. Do not take double or extra doses. What may interact with this medication? Do not take this medication with any of the following: Sodium phenylbutyrate This medication may also interact with the following: Aspirin Certain antibiotics, such as ertapenem, imipenem, meropenem Certain medications for depression, anxiety, or other mental health conditions Certain medications for seizures, such as cannabidiol, carbamazepine, clonazepam, diazepam, ethosuximide, felbamate, lamotrigine, phenobarbital, phenytoin, primidone, rufinamide, topiramate Certain medications that treat or prevent blood clots, such as warfarin Cholestyramine Estrogen and progestin hormones Methotrexate Propofol Rifampin Ritonavir Tolbutamide Zidovudine This list may not describe all possible interactions. Give your health care provider a list of all the medicines, herbs, non-prescription drugs, or dietary supplements you use. Also tell them if you smoke, drink alcohol, or use illegal drugs. Some items may interact with your medicine. What should I watch for while using this medication? Tell your care team if your symptoms do not get better or they start to get worse. This medication may cause serious skin reactions. They can happen weeks to months after starting the medication. Contact your care team right away if you notice fevers or flu-like symptoms with a rash. The rash may be red or purple and then turn into blisters or peeling of the skin. Or, you might notice a red rash with swelling of the face, lips or lymph nodes in your neck or under your arms. Wear a medical ID bracelet or chain, and carry a card that describes  your disease and details of your medication and dosage times. You may get drowsy, dizzy, or have blurred vision. Do not drive, use machinery, or do anything that needs mental alertness  until you know how this medication affects you. To reduce dizzy or fainting spells, do not sit or stand up quickly, especially if you are an older patient. Alcohol can increase drowsiness and dizziness. Avoid alcoholic drinks. This medication can make you more sensitive to the sun. Keep out of the sun. If you cannot avoid being in the sun, wear protective clothing and use sunscreen. Do not use sun lamps or tanning beds/booths. Patients and their families should watch out for new or worsening depression or thoughts of suicide. Also watch out for sudden changes in feelings such as feeling anxious, agitated, panicky, irritable, hostile, aggressive, impulsive, severely restless, overly excited and hyperactive, or not being able to sleep. If this happens, especially at the beginning of treatment or after a change in dose, call your care team. Women should inform their care team if they wish to become pregnant or think they might be pregnant. There is a potential for serious side effects to an unborn child. Talk to your care team or pharmacist for more information. Women who become pregnant while using this medication may enroll in the [place] American Antiepileptic Drug Pregnancy Registry by calling [phone]. This registry collects information about the safety of antiepileptic medication use during pregnancy. This medication may cause a decrease in folic acid and vitamin D. You should make sure that you get enough vitamins while you are taking this medication. Discuss the foods you eat and the vitamins you take with your care team. What side effects may I notice from receiving this medication? Side effects that you should report to your care team as soon as possible: Allergic reactions--skin rash, itching, hives, swelling of the face, lips, tongue, or throat High ammonia level--unusual weakness or fatigue, confusion, loss of appetite, nausea, vomiting, seizures Liver injury--right upper belly pain, loss of  appetite, nausea, light-colored stool, dark yellow or brown urine, yellowing skin or eyes, unusual weakness or fatigue Low body temperature, drowsiness, confusion Pancreatitis--severe stomach pain that spreads to your back or gets worse after eating or when touched, fever, nausea, vomiting Rash, fever, and swollen lymph nodes Thoughts of suicide or self-harm, worsening mood, feelings of depression Unusual bruising or bleeding Side effects that usually do not require medical attention (report to your care team if they continue or are bothersome): Change in vision Dizziness Drowsiness Hair loss Headache Nausea Tremors or shaking Weight gain This list may not describe all possible side effects. Call your doctor for medical advice about side effects. You may report side effects to FDA at 1-800-FDA-1088. Where should I keep my medication? Keep out of reach of children and pets. Store at room temperature between 15 and 30 degrees C (59 and 86 degrees F). Keep container tightly closed. Throw away any unused medication after the expiration date. NOTE: This sheet is a summary. It may not cover all possible information. If you have questions about this medicine, talk to your doctor, pharmacist, or health care provider.  2024 Elsevier/Gold Standard (2021-05-23 00:00:00)

## 2023-01-20 NOTE — Progress Notes (Signed)
Subjective:  Patient ID: Laura English, female    DOB: [birth year]  Age: 42 y.o. MRN: [id number]  CC: Depression (Buspar not working)   HPI Laura English is a 42 y.o. 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   Interval History: Discussed the use of AI scribe software for clinical note transcription with the patient, who gave verbal consent to proceed.  She presents with worsening depression and mood instability. She reports that Buspar has not been effective, leading to increased emotional lability and frequent crying. She also reports increased irritability that escalates to anger, and recently experienced a panic attack at work. She describes periods of low energy and motivation, with difficulty controlling her mood swings. She has difficulty sleeping due to restlessness, and recently slept for 17 hours after a night of insomnia. She previously tried Lexapro, but discontinued it due to perceived worsening of her depression and most recently has been on BuSpar which has been ineffective.  She was previously on a regimen of four medications, including a mood stabilizer, through [facility] in 2019, but found it to be too much. She expresses a desire for a medication that can help control her mood swings and prevent her from feeling lethargic.      She has also noticed one of her toes is numb intermittently.  Also endorses excessive thirst.  Past Medical History:  Diagnosis Date   Depression    hx depression - no meds - no current problems   Heartburn in pregnancy    on pepcid   No pertinent past medical history     Past Surgical History:  Procedure Laterality Date   CESAREAN SECTION     3x   TUBAL LIGATION      Family History  Problem Relation Age of Onset   Emphysema Mother    Dementia Maternal Grandfather    Alcohol abuse Father    Mental illness Father    Mental illness Brother    Diabetes  Maternal Grandmother     Social History   Socioeconomic History   Marital status: Single    Spouse name: Not on file   Number of children: Not on file   Years of education: Not on file   Highest education level: Not on file  Occupational History   Not on file  Tobacco Use   Smoking status: Every Day    Current packs/day: 1.00    Average packs/day: 1 pack/day for 10.0 years (10.0 ttl pk-yrs)    Types: Cigarettes   Smokeless tobacco: Never   Tobacco comments:    e- cig  Substance and Sexual Activity   Alcohol use: No   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   Sexual activity: Yes    Birth control/protection: None  Other Topics Concern   Not on file  Social History Narrative   Not on file   Social Determinants of Health   Financial Resource Strain: Medium Risk (01/20/2023)   Overall Financial Resource Strain (CARDIA)    Difficulty of Paying Living Expenses: Somewhat hard  Food Insecurity: Food Insecurity Present (01/20/2023)   Hunger Vital Sign    Worried About Running Out of Food in the Last Year: Sometimes true    Ran Out of Food in the Last Year: Sometimes true  Transportation Needs: No Transportation Needs (01/20/2023)   PRAPARE - Transportation  Lack of Transportation (Medical): No    Lack of Transportation (Non-Medical): No  Physical Activity: Inactive (01/20/2023)   Exercise Vital Sign    Days of Exercise per Week: 0 days    Minutes of Exercise per Session: 0 min  Stress: Stress Concern Present (01/20/2023)   [employer] of Occupational Health - Occupational Stress Questionnaire    Feeling of Stress : To some extent  Social Connections: Socially Isolated (01/20/2023)   Social Connection and Isolation Panel [NHANES]    Frequency of Communication with Friends and Family: Twice a week    Frequency of Social Gatherings with Friends and Family: Three times a week    Attends Religious Services: Never    Active Member of  Clubs or Organizations: No    Attends Banker Meetings: Never    Marital Status: Divorced    No Known Allergies  Outpatient Medications Prior to Visit  Medication Sig Dispense Refill   busPIRone (BUSPAR) 15 MG tablet Take 1 tablet (15 mg total) by mouth 2 (two) times daily. 60 tablet 3   albuterol (VENTOLIN HFA) 108 (90 Base) MCG/ACT inhaler Inhale 2 puffs into the lungs every 6 (six) hours as needed for wheezing or shortness of breath. (Patient not taking: Reported on 01/20/2023) 8 g 2   benzonatate (TESSALON) 100 MG capsule Take 2 capsules (200 mg total) by mouth 2 (two) times daily as needed for cough. (Patient not taking: Reported on 01/20/2023) 40 capsule 0   fluticasone furoate-vilanterol (BREO ELLIPTA) 100-25 MCG/ACT AEPB Inhale 1 puff into the lungs daily. (Patient not taking: Reported on 01/20/2023) 60 each 2   metroNIDAZOLE (FLAGYL) 500 MG tablet Take 1 tablet (500 mg total) by mouth 2 (two) times daily. (Patient not taking: Reported on 01/20/2023) 14 tablet 0   omeprazole (PRILOSEC) 40 MG capsule Take 1 capsule (40 mg total) by mouth daily. (Patient not taking: Reported on 05/09/2021) 30 capsule 1   No facility-administered medications prior to visit.     ROS Review of Systems  Constitutional:  Negative for activity change and appetite change.  HENT:  Negative for sinus pressure and sore throat.   Respiratory:  Negative for chest tightness, shortness of breath and wheezing.   Cardiovascular:  Negative for chest pain and palpitations.  Gastrointestinal:  Negative for abdominal distention, abdominal pain and constipation.  Genitourinary: Negative.   Musculoskeletal: Negative.   Psychiatric/Behavioral:         See HPI    Objective:  BP 101/62   Pulse 71   Ht 5\' 1"  (1.549 m)   Wt 112 lb 12.8 oz (51.2 kg)   SpO2 99%   BMI 21.31 kg/m      01/20/2023   10:06 AM 12/25/2022   11:30 AM 05/09/2021   10:56 AM  BP/Weight  Systolic BP 101 114 100  Diastolic BP  62 75 68  Wt. (Lbs) 112.8 113.2 119  BMI 21.31 kg/m2 21.39 kg/m2 22.48 kg/m2      Physical Exam Constitutional:      Appearance: She is well-developed.  Cardiovascular:     Rate and Rhythm: Normal rate.     Heart sounds: Normal heart sounds. No murmur heard. Pulmonary:     Effort: Pulmonary effort is normal.     Breath sounds: Normal breath sounds. No wheezing or rales.  Chest:     Chest wall: No tenderness.  Abdominal:     General: Bowel sounds are normal. There is no distension.     Palpations: Abdomen  is soft. There is no mass.     Tenderness: There is no abdominal tenderness.  Musculoskeletal:        General: Normal range of motion.     Right lower leg: No edema.     Left lower leg: No edema.  Neurological:     Mental Status: She is alert and oriented to person, place, and time.  Psychiatric:        Mood and Affect: Mood normal.        Latest Ref Rng & Units 11/30/2020   10:19 PM 01/08/2018    9:43 AM 10/21/2017    5:05 PM  CMP  Glucose 70 - 99 mg/dL 409  84  95   BUN 6 - 20 mg/dL 13  9  6    Creatinine 0.44 - 1.00 mg/dL 8.11  9.14  7.82   Sodium 135 - 145 mmol/L 135  139  141   Potassium 3.5 - 5.1 mmol/L 3.6  4.7  4.3   Chloride 98 - 111 mmol/L 104  105  109   CO2 22 - 32 mmol/L 23  20  28    Calcium 8.9 - 10.3 mg/dL 9.0  9.1  9.2   Total Protein 6.5 - 8.1 g/dL 7.8  6.5  7.0   Total Bilirubin 0.3 - 1.2 mg/dL 0.5  0.3  0.3   Alkaline Phos 38 - 126 U/L 67  74  58   AST 15 - 41 U/L 17  13  18    ALT 0 - 44 U/L 9  10  15      Lipid Panel     Component Value Date/Time   CHOL 165 01/08/2018 0943   TRIG 110 01/08/2018 0943   HDL 45 01/08/2018 0943   CHOLHDL 3.7 01/08/2018 0943   CHOLHDL 3.7 12/07/2012 1221   VLDL 19 12/07/2012 1221   LDLCALC 98 01/08/2018 0943    CBC    Component Value Date/Time   WBC 15.4 (H) 11/30/2020 2219   RBC 4.14 11/30/2020 2219   HGB 13.6 11/30/2020 2219   HCT 41.0 11/30/2020 2219   PLT 407 (H) 11/30/2020 2219   MCV 99.0  11/30/2020 2219   MCH 32.9 11/30/2020 2219   MCHC 33.2 11/30/2020 2219   RDW 13.8 11/30/2020 2219   LYMPHSABS 2.9 11/30/2020 2219   MONOABS 0.8 11/30/2020 2219   EOSABS 0.1 11/30/2020 2219   BASOSABS 0.1 11/30/2020 2219    No results found for: "HGBA1C"    Assessment & Plan:      Bipolar Disorder Patient reports increased irritability, low mood, low energy, and sleep disturbances. Previous trials of Lexapro and Buspar were ineffective or worsened symptoms. Patient has history of being on a mood stabilizer and multiple other medications in 2019, but found it to be too much. -Start Depakote as a mood stabilizer. -Follow up in 6 weeks to assess efficacy and potential dose adjustment.  Screening for diabetes. Patient reports symptoms of polydipsia, numbness in toes, and foot swelling. Family history of diabetes. -Order A1C with upcoming blood work on 01/22/2023 to screen for diabetes.  General Health Maintenance -Administer influenza vaccine today. -Complete blood work including kidney function, liver function, and blood count on 01/22/2023.          Meds ordered this encounter  Medications   divalproex (DEPAKOTE ER) 500 MG 24 hr tablet    Sig: Take 1 tablet (500 mg total) by mouth daily.    Dispense:  30 tablet    Refill:  1    Follow-up: Return in about 6 weeks (around 03/03/2023) for Chronic medical conditions.       Hoy Register, MD, FAAFP. [facility] [place], [place] [phone]   01/20/2023, 10:53 AM

## 2023-01-22 ENCOUNTER — Ambulatory Visit: Payer: BC Managed Care – PPO | Attending: Family Medicine

## 2023-01-22 DIAGNOSIS — Z13228 Encounter for screening for other metabolic disorders: Secondary | ICD-10-CM

## 2023-01-22 DIAGNOSIS — F3160 Bipolar disorder, current episode mixed, unspecified: Secondary | ICD-10-CM

## 2023-01-22 DIAGNOSIS — R202 Paresthesia of skin: Secondary | ICD-10-CM

## 2023-01-23 LAB — CMP14+EGFR
ALT: 8 [IU]/L (ref 0–32)
AST: 13 [IU]/L (ref 0–40)
Albumin: 3.8 g/dL — ABNORMAL LOW (ref 3.9–4.9)
Alkaline Phosphatase: 76 [IU]/L (ref 44–121)
BUN/Creatinine Ratio: 14 (ref 9–23)
BUN: 9 mg/dL (ref 6–24)
Bilirubin Total: <0.2 mg/dL (ref 0.0–1.2)
CO2: 20 mmol/L (ref 20–29)
Calcium: 9 mg/dL (ref 8.7–10.2)
Chloride: 108 mmol/L — ABNORMAL HIGH (ref 96–106)
Creatinine, Ser: 0.63 mg/dL (ref 0.57–1.00)
Globulin, Total: 2.6 g/dL (ref 1.5–4.5)
Glucose: 85 mg/dL (ref 70–99)
Potassium: 4.9 mmol/L (ref 3.5–5.2)
Sodium: 142 mmol/L (ref 134–144)
Total Protein: 6.4 g/dL (ref 6.0–8.5)
eGFR: 114 mL/min/{1.73_m2} (ref >59)

## 2023-01-23 LAB — CBC WITH DIFFERENTIAL/PLATELET
Basophils Absolute: 0.1 10*3/uL (ref 0.0–0.2)
Basos: 0 %
EOS (ABSOLUTE): 0.6 10*3/uL — ABNORMAL HIGH (ref 0.0–0.4)
Eos: 4 %
Hematocrit: 42.5 % (ref 34.0–46.6)
Hemoglobin: 13.5 g/dL (ref 11.1–15.9)
Immature Grans (Abs): 0 10*3/uL (ref 0.0–0.1)
Immature Granulocytes: 0 %
Lymphocytes Absolute: 3.3 10*3/uL — ABNORMAL HIGH (ref 0.7–3.1)
Lymphs: 21 %
MCH: 31.9 pg (ref 26.6–33.0)
MCHC: 31.8 g/dL (ref 31.5–35.7)
MCV: 101 fL — ABNORMAL HIGH (ref 79–97)
Monocytes Absolute: 0.8 10*3/uL (ref 0.1–0.9)
Monocytes: 5 %
Neutrophils Absolute: 11.3 10*3/uL — ABNORMAL HIGH (ref 1.4–7.0)
Neutrophils: 70 %
Platelets: 340 10*3/uL (ref 150–450)
RBC: 4.23 x10E6/uL (ref 3.77–5.28)
RDW: 12.9 % (ref 11.7–15.4)
WBC: 16.1 10*3/uL — ABNORMAL HIGH (ref 3.4–10.8)

## 2023-01-23 LAB — HEMOGLOBIN A1C
Est. average glucose Bld gHb Est-mCnc: 105 mg/dL
Hgb A1c MFr Bld: 5.3 % (ref 4.8–5.6)

## 2023-01-23 LAB — VITAMIN B12: Vitamin B-12: 416 pg/mL (ref 232–1245)

## 2023-01-23 LAB — LP+NON-HDL CHOLESTEROL
Cholesterol, Total: 153 mg/dL (ref 100–199)
HDL: 43 mg/dL (ref >39)
LDL Chol Calc (NIH): 80 mg/dL (ref 0–99)
Total Non-HDL-Chol (LDL+VLDL): 110 mg/dL (ref 0–129)
Triglycerides: 176 mg/dL — ABNORMAL HIGH (ref 0–149)
VLDL Cholesterol Cal: 30 mg/dL (ref 5–40)

## 2023-01-26 ENCOUNTER — Encounter (HOSPITAL_COMMUNITY): Payer: Self-pay | Admitting: Emergency Medicine

## 2023-01-26 ENCOUNTER — Ambulatory Visit (HOSPITAL_COMMUNITY)
Admission: EM | Admit: 2023-01-26 | Discharge: 2023-01-26 | Disposition: A | Discharge status: Home / Self Care | Payer: Worker's Compensation | Attending: Internal Medicine | Admitting: Internal Medicine

## 2023-01-26 DIAGNOSIS — S91011A Laceration without foreign body, right ankle, initial encounter: Secondary | ICD-10-CM | POA: Diagnosis not present

## 2023-01-26 MED ORDER — FLUCONAZOLE 150 MG PO TABS: 150.0000 mg | ORAL_TABLET | Freq: Every day | ORAL | 0 refills | Status: DC

## 2023-01-26 MED ORDER — DOXYCYCLINE HYCLATE 100 MG PO CAPS: 100.0000 mg | ORAL_CAPSULE | Freq: Two times a day (BID) | ORAL | 0 refills | Status: AC

## 2023-01-26 NOTE — Discharge Instructions (Addendum)
Laceration repaired with topical clip sutures. Leave this in place for 7-10 days then may peel this off. Leave dressing in place until tomorrow, then may remove and shower like normal and replace with a dry dressing. Can use ace wrap for comfort/support.   Start Doxycycline 100mg  twice daily for 5 days. Diflucan 1 tablet now and repeat in 3 days.   Return to urgent care or PCP if symptoms worsen or fail to resolve.

## 2023-01-26 NOTE — ED Triage Notes (Signed)
Pt presents after she dropped a glass last night that broke and cut foot. She was seen by ems last night and told she didn't need to go to ER. States it has been bleeding a lot and she has not tried to clean it but the glass was dirty.   She has started incident report for work.

## 2023-01-26 NOTE — ED Provider Notes (Addendum)
[facility]    CSN: [id number] Arrival date & time: 01/26/23  1123      History   Chief Complaint Chief Complaint  Patient presents with   Foot Injury    HPI VARNELL KEAVENEY is a 42 y.o. female.   42 year old female who presents to urgent care with complaints of a laceration on the right medial lateral ankle.  This happened yesterday at work when she dropped a glass.  She attempted to catch it with her foot but it still hit the floor and shattered.  This caused a laceration on her lower leg/ankle.  Initially it bled significantly prompting EMS to be called.  EMS evaluated the area and took approximately 10 minutes to get the bleeding to completely stop.  They advised the patient that she did not need to go to the hospital for suturing.  The patient woke up this morning with the area still oozing some blood which was concerning to her.  She was also concerned as the area was not cleansed last night and she did not want to risk infection.  She also noted some swelling in the area and wanted to have this evaluated.  She denies any fevers or chills.  She is following this with [employer]'s [employer].   Foot Injury Associated symptoms: no back pain and no fever     Past Medical History:  Diagnosis Date   Depression    hx depression - no meds - no current problems   Heartburn in pregnancy    on pepcid   No pertinent past medical history     Patient Active Problem List   Diagnosis Date Noted   Substance induced mood disorder (HCC) 10/22/2017   Carpal tunnel syndrome 06/04/2013   Insomnia 01/04/2013   Routine general medical examination at a health care facility 12/07/2012   GAD (generalized anxiety disorder) 12/07/2012   Hip pain 12/07/2012    Past Surgical History:  Procedure Laterality Date   CESAREAN SECTION     3x   TUBAL LIGATION      OB History     Gravida  3   Para  3   Term  3   Preterm      AB      Living  3      SAB      IAB       Ectopic      Multiple      Live Births  1            Home Medications    Prior to Admission medications   Medication Sig Start Date End Date Taking? Authorizing Provider  doxycycline (VIBRAMYCIN) 100 MG capsule Take 1 capsule (100 mg total) by mouth 2 (two) times daily for 5 days. 01/26/23 01/31/23 Yes Adeline Petitfrere A, PA-C  fluconazole (DIFLUCAN) 150 MG tablet Take 1 tablet (150 mg total) by mouth daily. Take 1 tablet now and repeat in 3 days 01/26/23  Yes Ethyle Tiedt A, PA-C  albuterol (VENTOLIN HFA) 108 (90 Base) MCG/ACT inhaler Inhale 2 puffs into the lungs every 6 (six) hours as needed for wheezing or shortness of breath. Patient not taking: Reported on 01/20/2023 05/09/21   Anders Simmonds, PA-C  benzonatate (TESSALON) 100 MG capsule Take 2 capsules (200 mg total) by mouth 2 (two) times daily as needed for cough. Patient not taking: Reported on 01/20/2023 05/02/21   Mayers, Cari S, PA-C  busPIRone (BUSPAR) 15 MG tablet Take 1 tablet (15  mg total) by mouth 2 (two) times daily. Patient not taking: Reported on 01/26/2023 12/25/22   Anders Simmonds, PA-C  divalproex (DEPAKOTE ER) 500 MG 24 hr tablet Take 1 tablet (500 mg total) by mouth daily. 01/20/23   Hoy Register, MD  fluticasone furoate-vilanterol (BREO ELLIPTA) 100-25 MCG/ACT AEPB Inhale 1 puff into the lungs daily. Patient not taking: Reported on 01/20/2023 05/09/21   Hoy Register, MD  metroNIDAZOLE (FLAGYL) 500 MG tablet Take 1 tablet (500 mg total) by mouth 2 (two) times daily. Patient not taking: Reported on 01/20/2023 12/27/22   Anders Simmonds, PA-C  omeprazole (PRILOSEC) 40 MG capsule Take 1 capsule (40 mg total) by mouth daily. Patient not taking: Reported on 05/09/2021 12/06/20   Hoy Register, MD    Family History Family History  Problem Relation Age of Onset   Emphysema Mother    Dementia Maternal Grandfather    Alcohol abuse Father    Mental illness Father    Mental illness Brother    Diabetes  Maternal Grandmother     Social History Social History   Tobacco Use   Smoking status: Every Day    Current packs/day: 1.00    Average packs/day: 1 pack/day for 10.0 years (10.0 ttl pk-yrs)    Types: Cigarettes   Smokeless tobacco: Never   Tobacco comments:    e- cig  Substance Use Topics   Alcohol use: No   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     Allergies   Patient has no known allergies.   Review of Systems Review of Systems  Constitutional:  Negative for chills and fever.  HENT:  Negative for ear pain and sore throat.   Eyes:  Negative for pain and visual disturbance.  Respiratory:  Negative for cough and shortness of breath.   Cardiovascular:  Negative for chest pain and palpitations.  Gastrointestinal:  Negative for abdominal pain and vomiting.  Genitourinary:  Negative for dysuria and hematuria.  Musculoskeletal:  Negative for arthralgias and back pain.  Skin:  Negative for color change and rash.       Laceration on the right antero-medial ankle  Neurological:  Negative for seizures and syncope.  All other systems reviewed and are negative.    Physical Exam Triage Vital Signs ED Triage Vitals  Encounter Vitals Group     BP 01/26/23 1216 114/77     Systolic BP Percentile --      Diastolic BP Percentile --      Pulse Rate 01/26/23 1216 67     Resp 01/26/23 1216 16     Temp 01/26/23 1216 98.5 F (36.9 C)     Temp Source 01/26/23 1216 Oral     SpO2 01/26/23 1216 96 %     Weight --      Height --      Head Circumference --      Peak Flow --      Pain Score 01/26/23 1221 6     Pain Loc --      Pain Education --      Exclude from Growth Chart --    No data found.  Updated Vital Signs BP 114/77 (BP Location: Right Arm)   Pulse 67   Temp 98.5 F (36.9 C) (Oral)   Resp 16   LMP 01/26/2023 (Exact Date)   SpO2 96%   Visual Acuity Right Eye Distance:   Left Eye Distance:  Bilateral Distance:    Right Eye  Near:   Left Eye Near:    Bilateral Near:     Physical Exam Vitals and nursing note reviewed.  Constitutional:      General: She is not in acute distress.    Appearance: She is well-developed.  HENT:     Head: Normocephalic and atraumatic.  Eyes:     Conjunctiva/sclera: Conjunctivae normal.  Cardiovascular:     Rate and Rhythm: Normal rate and regular rhythm.     Heart sounds: No murmur heard. Pulmonary:     Effort: Pulmonary effort is normal. No respiratory distress.     Breath sounds: Normal breath sounds.  Abdominal:     Palpations: Abdomen is soft.     Tenderness: There is no abdominal tenderness.  Musculoskeletal:        General: No swelling.     Cervical back: Neck supple.  Skin:    General: Skin is warm and dry.     Capillary Refill: Capillary refill takes less than 2 seconds.       Neurological:     Mental Status: She is alert.  Psychiatric:        Mood and Affect: Mood normal.      UC Treatments / Results  Labs (all labs ordered are listed, but only abnormal results are displayed) Labs Reviewed - No data to display  EKG   Radiology No results found.  Procedures Procedures (including critical care time)  Medications Ordered in UC Medications - No data to display  Initial Impression / Assessment and Plan / UC Course  I have reviewed the triage vital signs and the nursing notes.  Pertinent labs & imaging results that were available during my care of the patient were reviewed by me and considered in my medical decision making (see chart for details).     Laceration of right ankle, initial encounter   As laceration was more than 12 hours old and given that it is not significantly deep, suturing was deferred however we did use a topical clip suture to approximate the skin edges.  Will have her leave this in place for 7 to 10 days then peel this off.  A compression bandage was placed today and advised for her to leave this until tomorrow then may  remove and wash the area with soap and water like normal.  Recommend replacing the dressing and changing this 1-2 times daily until the area is completely healed.  Will start her on doxycycline 100 mg twice daily for 5 days.  She is prone to vaginal yeast infections while on antibiotics therefore Diflucan was called in as well.  Recommend following up here as needed. Final Clinical Impressions(s) / UC Diagnoses   Final diagnoses:  Laceration of right ankle, initial encounter     Discharge Instructions      Laceration repaired with topical clip sutures. Leave this in place for 7-10 days then may peel this off. Leave dressing in place until tomorrow, then may remove and shower like normal and replace with a dry dressing. Can use ace wrap for comfort/support.   Start Doxycycline 100mg  twice daily for 5 days. Diflucan 1 tablet now and repeat in 3 days.   Return to urgent care or PCP if symptoms worsen or fail to resolve.     ED Prescriptions     Medication Sig Dispense Auth. Provider   doxycycline (VIBRAMYCIN) 100 MG capsule Take 1 capsule (100 mg total) by mouth 2 (two) times  daily for 5 days. 10 capsule Doristine Mango A, PA-C   fluconazole (DIFLUCAN) 150 MG tablet Take 1 tablet (150 mg total) by mouth daily. Take 1 tablet now and repeat in 3 days 2 tablet Landis Martins, PA-C      PDMP not reviewed this encounter.   Landis Martins, PA-C 01/26/23 1342    Landis Martins, PA-C 01/26/23 1342

## 2023-01-27 ENCOUNTER — Ambulatory Visit: Payer: BC Managed Care – PPO | Admitting: Obstetrics and Gynecology

## 2023-01-29 ENCOUNTER — Encounter: Payer: Self-pay | Admitting: Obstetrics and Gynecology

## 2023-01-29 ENCOUNTER — Ambulatory Visit (INDEPENDENT_AMBULATORY_CARE_PROVIDER_SITE_OTHER): Payer: BC Managed Care – PPO | Admitting: Obstetrics and Gynecology

## 2023-01-29 VITALS — BP 98/62 | HR 69 | Ht 62.21 in | Wt 110.0 lb

## 2023-01-29 DIAGNOSIS — F17200 Nicotine dependence, unspecified, uncomplicated: Secondary | ICD-10-CM | POA: Diagnosis not present

## 2023-01-29 DIAGNOSIS — Z23 Encounter for immunization: Secondary | ICD-10-CM

## 2023-01-29 DIAGNOSIS — R87610 Atypical squamous cells of undetermined significance on cytologic smear of cervix (ASC-US): Secondary | ICD-10-CM | POA: Insufficient documentation

## 2023-01-29 DIAGNOSIS — R87810 Cervical high risk human papillomavirus (HPV) DNA test positive: Secondary | ICD-10-CM | POA: Insufficient documentation

## 2023-01-29 NOTE — Assessment & Plan Note (Addendum)
Per ASCCP guidelines, recommend co-testing in 1 year given prior normal PAP. Reviewed associated between HPV and cervical cancer. I also recommend completion of Gardasil vaccine, avoidance of smoking, and use of condoms with new sexual partners to prevent progression of disease. All questions answered

## 2023-01-29 NOTE — Assessment & Plan Note (Signed)
Encouraged cessation.

## 2023-01-29 NOTE — Progress Notes (Signed)
42 y.o. [id number] female s/p BTL with tobacco use and history of substance abuse here for consultation for abnormal PAP.  Reports boyfriend recently cheated on her with a friend who is a sex Financial controller. She was positive for trichomonas and treated by PCP. She is no longer with her boyfriend.  Decreasing cigarette use, now smoking 1/2ppd. Does not want to use medications to quit. Notes that this is harder than quitting meth. Does not have a quit date in mind and is not to ready to set one.     Component Value Date/Time   DIAGPAP (A) 12/25/2022 1208    - Atypical squamous cells of undetermined significance (ASC-US)   DIAGPAP  01/01/2018 0000    NEGATIVE FOR INTRAEPITHELIAL LESIONS OR MALIGNANCY.   HPVHIGH Positive (A) 12/25/2022 1208   ADEQPAP  12/25/2022 1208    Satisfactory for evaluation; transformation zone component PRESENT.   ADEQPAP  01/01/2018 0000    Satisfactory for evaluation  endocervical/transformation zone component ABSENT.   Patient's last menstrual period was 01/26/2023 (exact date). Period Duration (Days): 5-7 Period Pattern: Regular Menstrual Flow: Heavy Menstrual Control: Maxi pad, Tampon Dysmenorrhea: (!) Severe Dysmenorrhea Symptoms: Cramping, Headache, Other (Comment), Diarrhea (fatigue)  Birth control: BTL Sexually active: previously   GYN HISTORY: Prior CS and BTL  OB History  Gravida Para Term Preterm AB Living  3 3 3     3   SAB IAB Ectopic Multiple Live Births          1    # Outcome Date GA Lbr Len/2nd Weight Sex Type Anes PTL Lv  3 Term 10/30/10 [redacted]w[redacted]d  5 lb 8.7 oz (2.515 kg) F CS-LTranv Spinal  LIV  2 Term           1 Term             Past Medical History:  Diagnosis Date   Depression    hx depression - no meds - no current problems   Heartburn in pregnancy    on pepcid   No pertinent past medical history     Past Surgical History:  Procedure Laterality Date   CESAREAN SECTION     3x   TUBAL LIGATION      Current Outpatient  Medications on File Prior to Visit  Medication Sig Dispense Refill   divalproex (DEPAKOTE ER) 500 MG 24 hr tablet Take 1 tablet (500 mg total) by mouth daily. 30 tablet 1   doxycycline (VIBRAMYCIN) 100 MG capsule Take 1 capsule (100 mg total) by mouth 2 (two) times daily for 5 days. 10 capsule 0   fluconazole (DIFLUCAN) 150 MG tablet Take 1 tablet (150 mg total) by mouth daily. Take 1 tablet now and repeat in 3 days 2 tablet 0   No current facility-administered medications on file prior to visit.    No Known Allergies    PE Today's Vitals   01/29/23 0933  BP: 98/62  Pulse: 69  SpO2: 99%  Weight: 110 lb (49.9 kg)  Height: 5' 2.21" (1.58 m)   Body mass index is 19.99 kg/m.  Physical Exam Vitals reviewed. Exam conducted with a chaperone present.  Constitutional:      General: She is not in acute distress.    Appearance: Normal appearance.  HENT:     Head: Normocephalic and atraumatic.     Nose: Nose normal.  Eyes:     Extraocular Movements: Extraocular movements intact.     Conjunctiva/sclera: Conjunctivae normal.  Pulmonary:  Effort: Pulmonary effort is normal.  Genitourinary:    General: Normal vulva.     Exam position: Lithotomy position.     Vagina: Normal. No vaginal discharge.     Cervix: Normal. No cervical motion tenderness, discharge or lesion.     Uterus: Normal. Not enlarged and not tender.      Adnexa: Right adnexa normal and left adnexa normal.  Musculoskeletal:        General: Normal range of motion.     Cervical back: Normal range of motion.  Neurological:     General: No focal deficit present.     Mental Status: She is alert.  Psychiatric:        Mood and Affect: Mood normal.        Behavior: Behavior normal.       Assessment and Plan:        Need for HPV vaccination -     HPV 9-valent vaccine,Recombinat  Atypical squamous cells of undetermined significance on cytologic smear of cervix (ASC-US) Assessment & Plan: Per ASCCP guidelines,  recommend co-testing in 1 year given prior normal PAP. Reviewed associated between HPV and cervical cancer. I also recommend completion of Gardasil vaccine, avoidance of smoking, and use of condoms with new sexual partners to prevent progression of disease. All questions answered    Cervical high risk HPV (human papillomavirus) test positive  Tobacco dependence Assessment & Plan: Encouraged cessation      Rosalyn Gess, MD

## 2023-02-28 ENCOUNTER — Other Ambulatory Visit: Payer: Self-pay

## 2023-03-03 ENCOUNTER — Ambulatory Visit: Payer: BC Managed Care – PPO | Admitting: Family Medicine

## 2023-03-26 ENCOUNTER — Encounter: Payer: Self-pay | Admitting: Physician Assistant

## 2023-03-26 ENCOUNTER — Ambulatory Visit: Payer: BC Managed Care – PPO | Attending: Physician Assistant | Admitting: Physician Assistant

## 2023-03-26 ENCOUNTER — Telehealth: Payer: Self-pay | Admitting: Family Medicine

## 2023-03-26 ENCOUNTER — Other Ambulatory Visit: Payer: Self-pay

## 2023-03-26 ENCOUNTER — Encounter: Payer: Self-pay | Admitting: Family Medicine

## 2023-03-26 VITALS — BP 103/68 | HR 72 | Wt 112.0 lb

## 2023-03-26 DIAGNOSIS — F3160 Bipolar disorder, current episode mixed, unspecified: Secondary | ICD-10-CM

## 2023-03-26 DIAGNOSIS — D72829 Elevated white blood cell count, unspecified: Secondary | ICD-10-CM | POA: Diagnosis not present

## 2023-03-26 DIAGNOSIS — N3001 Acute cystitis with hematuria: Secondary | ICD-10-CM | POA: Diagnosis not present

## 2023-03-26 DIAGNOSIS — F1911 Other psychoactive substance abuse, in remission: Secondary | ICD-10-CM | POA: Diagnosis not present

## 2023-03-26 LAB — POCT URINALYSIS DIP (CLINITEK)
Bilirubin, UA: NEGATIVE
Blood, UA: NEGATIVE
Glucose, UA: 100 mg/dL — AB
Nitrite, UA: POSITIVE — AB
POC PROTEIN,UA: NEGATIVE
Spec Grav, UA: 1.025 (ref 1.010–1.025)
Urobilinogen, UA: 1 U/dL
pH, UA: 6.5 (ref 5.0–8.0)

## 2023-03-26 MED ORDER — DIVALPROEX SODIUM ER 500 MG PO TB24
500.0000 mg | ORAL_TABLET | Freq: Every day | ORAL | 1 refills | Status: DC
  Filled 2023-03-26: qty 30, 30d supply, fill #0
  Filled 2023-04-30: qty 30, 30d supply, fill #1

## 2023-03-26 MED ORDER — HYDROXYZINE PAMOATE 25 MG PO CAPS
25.0000 mg | ORAL_CAPSULE | Freq: Three times a day (TID) | ORAL | 1 refills | Status: DC | PRN
  Filled 2023-03-26: qty 30, 10d supply, fill #0
  Filled 2023-04-30 (×2): qty 30, 10d supply, fill #1

## 2023-03-26 MED ORDER — SULFAMETHOXAZOLE-TRIMETHOPRIM 800-160 MG PO TABS
1.0000 | ORAL_TABLET | Freq: Two times a day (BID) | ORAL | 0 refills | Status: DC
  Filled 2023-03-26: qty 14, 7d supply, fill #0

## 2023-03-26 MED ORDER — FLUCONAZOLE 150 MG PO TABS
150.0000 mg | ORAL_TABLET | Freq: Every day | ORAL | 0 refills | Status: AC
  Filled 2023-03-26: qty 2, 2d supply, fill #0

## 2023-03-26 NOTE — Progress Notes (Signed)
Patient is worried about high white blood cell

## 2023-03-26 NOTE — Patient Instructions (Signed)
[email or web address] is the website for narcotics anonymous  [employer]com.cy (website) or 7853877261 is the information for alcoholics anonymous  Both are free and immediately available for help with alcohol and drug use and living sober!!

## 2023-03-26 NOTE — Progress Notes (Signed)
Patient ID: Laura English, female   DOB: 17-Feb-1981, 43 y.o.   MRN: [id number]   Laura English, is a 43 y.o. female  XBJ:[id number]  HYQ:[id number]  DOB - 1980-06-20  Chief Complaint  Patient presents with   Urinary Tract Infection       Subjective:   Laura English is a 43 y.o. female here today for several issues.  It appears she has had elevated WBC for a while and would like to see a specialist.  No FH autoimmune d/o or leukemia/lymphomas.  No fevers/night sweats/unexplained weight loss.    Also having urinary frequency and some bladder pain for about 1 week.  No abdominal or flank pain.  No fever.  No N/V.    Needs something for anxiety.  H/o meth use and finds herself struggling with anxiety.  Depakote is helping some but she needs something for when she has a panic attack or needs something immediately.  Requests something like xanax.  Currently SA in remission but not active in sobriety/12 step program for coping with/living sober.  Denies SI/HI.    No problems updated.  ALLERGIES: No Known Allergies  PAST MEDICAL HISTORY: Past Medical History:  Diagnosis Date   Depression    hx depression - no meds - no current problems   Heartburn in pregnancy    on pepcid   No pertinent past medical history     MEDICATIONS AT HOME: Prior to Admission medications   Medication Sig Start Date End Date Taking? Authorizing Provider  hydrOXYzine (VISTARIL) 25 MG capsule Take 1 capsule (25 mg total) by mouth every 8 (eight) hours as needed. For anxiety.  Use sparingly 03/26/23  Yes Hamad Whyte, Marzella Schlein, PA-C  sulfamethoxazole-trimethoprim (BACTRIM DS) 800-160 MG tablet Take 1 tablet by mouth 2 (two) times daily. 03/26/23  Yes Anders Simmonds, PA-C  divalproex (DEPAKOTE ER) 500 MG 24 hr tablet Take 1 tablet (500 mg total) by mouth daily. 03/26/23   Anders Simmonds, PA-C  fluconazole (DIFLUCAN) 150 MG tablet Take 1 tablet (150 mg total) by mouth daily. Take 1 tablet now and  repeat in 3 days 03/26/23   Anders Simmonds, PA-C    ROS: Neg HEENT Neg resp Neg cardiac Neg GI Neg MS Neg neuro  Objective:   Vitals:   03/26/23 1410  BP: 103/68  Pulse: 72  SpO2: 98%  Weight: 112 lb (50.8 kg)   Exam General appearance : Awake, alert, not in any distress. Speech Clear. Not toxic looking HEENT: Atraumatic and Normocephalic Neck: Supple, no JVD. No cervical lymphadenopathy.  Chest: Good air entry bilaterally, CTAB.  No rales/rhonchi/wheezing CVS: S1 S2 regular, no murmurs.  Extremities: B/L Lower Ext shows no edema, both legs are warm to touch Neurology: Awake alert, and oriented X 3, CN II-XII intact, Non focal Skin: No Rash  Data Review Lab Results  Component Value Date   HGBA1C 5.3 01/22/2023    Assessment & Plan   1. Acute cystitis with hematuria (Primary) Increase water intake to 80 ounces daily(some ketones in urine) - POCT URINALYSIS DIP (CLINITEK) - CBC with Differential - Urine Culture - sulfamethoxazole-trimethoprim (BACTRIM DS) 800-160 MG tablet; Take 1 tablet by mouth 2 (two) times daily.  Dispense: 14 tablet; Refill: 0 - fluconazole (DIFLUCAN) 150 MG tablet; Take 1 tablet (150 mg total) by mouth daily. Take 1 tablet now and repeat in 3 days  Dispense: 2 tablet; Refill: 0  2. Bipolar affective disorder, current episode mixed, current episode severity unspecified (HCC) -  continue depakote 500 daily - Valproic acid level - hydrOXYzine (VISTARIL) 25 MG capsule; Take 1 capsule (25 mg total) by mouth every 8 (eight) hours as needed. For anxiety.  Use sparingly  Dispense: 30 capsule; Refill: 1 Explained why benzos not a good option for most anyone but esp those that have had any addiction issues.  3. Substance abuse in remission (HCC) [facility] [employer].org is the website for narcotics anonymous [employer]cy (website) or [phone] is the information for alcoholics anonymous Both are free and immediately available for help with alcohol and drug  use Learning to cope sober and clean is a huge part of 12 step recovery and I believe would be helpful for a lot she has going on.    4. Leukocytosis, unspecified type - CBC with Differential - Ambulatory referral to Hematology / Oncology    Return in about 3 months (around 06/24/2023) for PCP for chronic conditions-[place].  The patient was given clear instructions to go to ER or return to medical center if symptoms don't improve, worsen or new problems develop. The patient verbalized understanding. The patient was told to call to get lab results if they haven't heard anything in the next week.      Georgian Co, PA-C [facility] [place], [place] [phone]   03/26/2023, 5:29 PM

## 2023-03-27 ENCOUNTER — Encounter: Payer: Self-pay | Admitting: Physician Assistant

## 2023-03-27 ENCOUNTER — Other Ambulatory Visit: Payer: Self-pay

## 2023-03-27 ENCOUNTER — Other Ambulatory Visit: Payer: Self-pay | Admitting: Physician Assistant

## 2023-03-27 ENCOUNTER — Telehealth: Payer: Self-pay | Admitting: Hematology and Oncology

## 2023-03-27 LAB — CBC WITH DIFFERENTIAL/PLATELET
Basophils Absolute: 0.1 10*3/uL (ref 0.0–0.2)
Basos: 1 %
EOS (ABSOLUTE): 0.5 10*3/uL — ABNORMAL HIGH (ref 0.0–0.4)
Eos: 4 %
Hematocrit: 36.5 % (ref 34.0–46.6)
Hemoglobin: 12 g/dL (ref 11.1–15.9)
Immature Grans (Abs): 0 10*3/uL (ref 0.0–0.1)
Immature Granulocytes: 0 %
Lymphocytes Absolute: 3.7 10*3/uL — ABNORMAL HIGH (ref 0.7–3.1)
Lymphs: 32 %
MCH: 31.7 pg (ref 26.6–33.0)
MCHC: 32.9 g/dL (ref 31.5–35.7)
MCV: 97 fL (ref 79–97)
Monocytes Absolute: 0.7 10*3/uL (ref 0.1–0.9)
Monocytes: 6 %
Neutrophils Absolute: 6.7 10*3/uL (ref 1.4–7.0)
Neutrophils: 57 %
Platelets: 416 10*3/uL (ref 150–450)
RBC: 3.78 x10E6/uL (ref 3.77–5.28)
RDW: 12.8 % (ref 11.7–15.4)
WBC: 11.7 10*3/uL — ABNORMAL HIGH (ref 3.4–10.8)

## 2023-03-27 LAB — VALPROIC ACID LEVEL: Valproic Acid Lvl: 33 ug/mL — ABNORMAL LOW (ref 50–100)

## 2023-03-27 MED ORDER — DIVALPROEX SODIUM ER 250 MG PO TB24
250.0000 mg | ORAL_TABLET | Freq: Every day | ORAL | 3 refills | Status: DC
  Filled 2023-03-27 – 2023-04-16 (×2): qty 30, 30d supply, fill #0
  Filled 2023-06-06: qty 30, 30d supply, fill #1
  Filled 2023-07-16: qty 30, 30d supply, fill #2
  Filled 2023-09-01 – 2023-10-03 (×2): qty 30, 30d supply, fill #3

## 2023-03-27 NOTE — Telephone Encounter (Signed)
Spoke with patient confirming upcoming appointment

## 2023-03-28 ENCOUNTER — Telehealth: Payer: Self-pay

## 2023-03-28 LAB — URINE CULTURE

## 2023-03-28 NOTE — Telephone Encounter (Signed)
Pt was called and vm was left, Information has been sent to nurse pool.    Also available in [place]

## 2023-03-28 NOTE — Telephone Encounter (Signed)
 Patient viewed results and Doctor comment through [facility]

## 2023-03-28 NOTE — Telephone Encounter (Signed)
-----   Message from Georgian Co sent at 03/27/2023  3:21 PM EST ----- Your white blood cells are still elevated but less than before.  I will still have you see hematology.  Your depakote level is a little low to help with anxiety so I am adding a 250mg  tablet to your regimen to take at bedtime.  Your urine culture is not back yet.  Make sure you are drinking 80 ounces water daily and taking the antibiotic.  Thanks, Georgian Co, PA-C

## 2023-03-28 NOTE — Telephone Encounter (Signed)
-----   Message from Georgian Co sent at 03/28/2023 10:01 AM EST ----- Your urine culture did not show growth of any particular bacteria.  You can complete the antibiotics and increase water intake as discussed.  If you continue to have pelvis or urinary symptoms,  please return to clinic or go to an urgent care.  Thanks, Georgian Co, PA-C

## 2023-03-31 NOTE — Telephone Encounter (Signed)
discard

## 2023-04-01 ENCOUNTER — Inpatient Hospital Stay: Payer: BC Managed Care – PPO

## 2023-04-01 ENCOUNTER — Inpatient Hospital Stay: Payer: BC Managed Care – PPO | Attending: Hematology and Oncology | Admitting: Hematology and Oncology

## 2023-04-01 NOTE — Progress Notes (Deleted)
 [facility] CONSULT NOTE  Patient Care Team: Delbert Clam, MD as PCP - General (Family Medicine)  CHIEF COMPLAINTS/PURPOSE OF CONSULTATION:  ***  ASSESSMENT & PLAN:  No problem-specific Assessment & Plan notes found for this encounter.  No orders of the defined types were placed in this encounter.    HISTORY OF PRESENTING ILLNESS:  Laura English 43 y.o. female is here because of ***  REVIEW OF SYSTEMS:   Constitutional: Denies fevers, chills or abnormal night sweats Eyes: Denies blurriness of vision, double vision or watery eyes Ears, nose, mouth, throat, and face: Denies mucositis or sore throat Respiratory: Denies cough, dyspnea or wheezes Cardiovascular: Denies palpitation, chest discomfort or lower extremity swelling Gastrointestinal:  Denies nausea, heartburn or change in bowel habits Skin: Denies abnormal skin rashes Lymphatics: Denies new lymphadenopathy or easy bruising Neurological:Denies numbness, tingling or new weaknesses Behavioral/Psych: Mood is stable, no new changes  All other systems were reviewed with the patient and are negative.  MEDICAL HISTORY:  Past Medical History:  Diagnosis Date   Depression    hx depression - no meds - no current problems   Heartburn in pregnancy    on pepcid   No pertinent past medical history     SURGICAL HISTORY: Past Surgical History:  Procedure Laterality Date   CESAREAN SECTION     3x   TUBAL LIGATION      SOCIAL HISTORY: Social History   Socioeconomic History   Marital status: Single    Spouse name: Not on file   Number of children: Not on file   Years of education: Not on file   Highest education level: Not on file  Occupational History   Not on file  Tobacco Use   Smoking status: Every Day    Current packs/day: 1.00    Average packs/day: 1 pack/day for 10.0 years (10.0 ttl pk-yrs)    Types: Cigarettes   Smokeless tobacco: Never   Tobacco comments:    e- cig  Substance and  Sexual Activity   Alcohol use: No   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   Sexual activity: Not Currently    Birth control/protection: None  Other Topics Concern   Not on file  Social History Narrative   Not on file   Social Drivers of Health   Financial Resource Strain: Medium Risk (01/20/2023)   Overall Financial Resource Strain (CARDIA)    Difficulty of Paying Living Expenses: Somewhat hard  Food Insecurity: Food Insecurity Present (01/20/2023)   Hunger Vital Sign    Worried About Running Out of Food in the Last Year: Sometimes true    Ran Out of Food in the Last Year: Sometimes true  Transportation Needs: No Transportation Needs (01/20/2023)   PRAPARE - Administrator, Civil Service (Medical): No    Lack of Transportation (Non-Medical): No  Physical Activity: Inactive (01/20/2023)   Exercise Vital Sign    Days of Exercise per Week: 0 days    Minutes of Exercise per Session: 0 min  Stress: Stress Concern Present (01/20/2023)   [employer] of Occupational Health - Occupational Stress Questionnaire    Feeling of Stress : To some extent  Social Connections: Socially Isolated (01/20/2023)   Social Connection and Isolation Panel [NHANES]    Frequency of Communication with Friends and Family: Twice a week    Frequency of Social Gatherings with Friends and Family: Three times a week  Attends Religious Services: Never    Active Member of Clubs or Organizations: No    Attends Banker Meetings: Never    Marital Status: Divorced  Catering Manager Violence: Not At Risk (01/20/2023)   Humiliation, Afraid, Rape, and Kick questionnaire    Fear of Current or Ex-Partner: No    Emotionally Abused: No    Physically Abused: No    Sexually Abused: No    FAMILY HISTORY: Family History  Problem Relation Age of Onset   Emphysema Mother    Dementia Maternal Grandfather    Alcohol abuse Father    Mental illness Father     Mental illness Brother    Diabetes Maternal Grandmother     ALLERGIES:  has no known allergies.  MEDICATIONS:  Current Outpatient Medications  Medication Sig Dispense Refill   divalproex  (DEPAKOTE  ER) 250 MG 24 hr tablet Take 1 tablet (250 mg total) by mouth at bedtime. 30 tablet 3   divalproex  (DEPAKOTE  ER) 500 MG 24 hr tablet Take 1 tablet (500 mg total) by mouth daily. 30 tablet 1   fluconazole  (DIFLUCAN ) 150 MG tablet Take 1 tablet (150 mg total) by mouth daily. Take 1 tablet now and repeat in 3 days 2 tablet 0   hydrOXYzine  (VISTARIL ) 25 MG capsule Take 1 capsule (25 mg total) by mouth every 8 (eight) hours as needed. For anxiety.  Use sparingly 30 capsule 1   sulfamethoxazole -trimethoprim  (BACTRIM  DS) 800-160 MG tablet Take 1 tablet by mouth 2 (two) times daily. 14 tablet 0   No current facility-administered medications for this visit.     PHYSICAL EXAMINATION: ECOG PERFORMANCE STATUS: {CHL ONC ECOG [phone]}  There were no vitals filed for this visit. There were no vitals filed for this visit.  GENERAL:alert, no distress and comfortable SKIN: skin color, texture, turgor are normal, no rashes or significant lesions EYES: normal, conjunctiva are pink and non-injected, sclera clear OROPHARYNX:no exudate, no erythema and lips, buccal mucosa, and tongue normal  NECK: supple, thyroid normal size, non-tender, without nodularity LYMPH:  no palpable lymphadenopathy in the cervical, axillary or inguinal LUNGS: clear to auscultation and percussion with normal breathing effort HEART: regular rate & rhythm and no murmurs and no lower extremity edema ABDOMEN:abdomen soft, non-tender and normal bowel sounds Musculoskeletal:no cyanosis of digits and no clubbing  PSYCH: alert & oriented x 3 with fluent speech NEURO: no focal motor/sensory deficits  LABORATORY DATA:  I have reviewed the data as listed Lab Results  Component Value Date   WBC 11.7 (H) 03/26/2023   HGB 12.0  03/26/2023   HCT 36.5 03/26/2023   MCV 97 03/26/2023   PLT 416 03/26/2023     Chemistry      Component Value Date/Time   NA 142 01/22/2023 0848   K 4.9 01/22/2023 0848   CL 108 (H) 01/22/2023 0848   CO2 20 01/22/2023 0848   BUN 9 01/22/2023 0848   CREATININE 0.63 01/22/2023 0848   CREATININE 0.60 12/07/2012 1221      Component Value Date/Time   CALCIUM 9.0 01/22/2023 0848   ALKPHOS 76 01/22/2023 0848   AST 13 01/22/2023 0848   ALT 8 01/22/2023 0848   BILITOT <0.2 01/22/2023 0848       RADIOGRAPHIC STUDIES: I have personally reviewed the radiological images as listed and agreed with the findings in the report. No results found.  All questions were answered. The patient knows to call the clinic with any problems, questions or concerns. I spent *** minutes  in the care of this patient including H and P, review of records, counseling and coordination of care.     Amber Stalls, MD 04/01/2023 10:06 AM

## 2023-04-07 ENCOUNTER — Other Ambulatory Visit: Payer: Self-pay

## 2023-04-16 ENCOUNTER — Other Ambulatory Visit: Payer: Self-pay

## 2023-04-17 ENCOUNTER — Other Ambulatory Visit: Payer: Self-pay

## 2023-04-21 ENCOUNTER — Other Ambulatory Visit: Payer: Self-pay

## 2023-04-22 ENCOUNTER — Telehealth: Payer: Self-pay | Admitting: Hematology and Oncology

## 2023-04-22 NOTE — Telephone Encounter (Signed)
 Patient is aware of rescheduled appointment times/dates, patient is aware they will need to arrive at least 20 mins prior for registration

## 2023-04-30 ENCOUNTER — Other Ambulatory Visit: Payer: Self-pay

## 2023-05-02 ENCOUNTER — Other Ambulatory Visit: Payer: Self-pay

## 2023-05-12 ENCOUNTER — Telehealth: Payer: Self-pay

## 2023-05-12 NOTE — Telephone Encounter (Signed)
 Number called states it's not in service.

## 2023-05-13 ENCOUNTER — Inpatient Hospital Stay: Payer: BC Managed Care – PPO

## 2023-05-13 ENCOUNTER — Inpatient Hospital Stay: Payer: BC Managed Care – PPO | Attending: Hematology and Oncology | Admitting: Hematology and Oncology

## 2023-05-13 VITALS — BP 119/78 | HR 86 | Temp 98.5°F | Resp 17 | Wt 114.3 lb

## 2023-05-13 DIAGNOSIS — Z79899 Other long term (current) drug therapy: Secondary | ICD-10-CM | POA: Insufficient documentation

## 2023-05-13 DIAGNOSIS — F17290 Nicotine dependence, other tobacco product, uncomplicated: Secondary | ICD-10-CM | POA: Diagnosis not present

## 2023-05-13 DIAGNOSIS — A630 Anogenital (venereal) warts: Secondary | ICD-10-CM | POA: Diagnosis not present

## 2023-05-13 DIAGNOSIS — F319 Bipolar disorder, unspecified: Secondary | ICD-10-CM | POA: Insufficient documentation

## 2023-05-13 DIAGNOSIS — F1290 Cannabis use, unspecified, uncomplicated: Secondary | ICD-10-CM | POA: Insufficient documentation

## 2023-05-13 DIAGNOSIS — D72820 Lymphocytosis (symptomatic): Secondary | ICD-10-CM | POA: Insufficient documentation

## 2023-05-13 LAB — HEPATITIS PANEL, ACUTE
HCV Ab: NONREACTIVE
Hep A IgM: NONREACTIVE
Hep B C IgM: NONREACTIVE
Hepatitis B Surface Ag: NONREACTIVE

## 2023-05-13 LAB — TECHNOLOGIST SMEAR REVIEW: Plt Morphology: NORMAL

## 2023-05-13 LAB — CBC WITH DIFFERENTIAL/PLATELET
Abs Immature Granulocytes: 0.04 10*3/uL (ref 0.00–0.07)
Basophils Absolute: 0.1 10*3/uL (ref 0.0–0.1)
Basophils Relative: 1 %
Eosinophils Absolute: 0.2 10*3/uL (ref 0.0–0.5)
Eosinophils Relative: 2 %
HCT: 36.5 % (ref 36.0–46.0)
Hemoglobin: 12.2 g/dL (ref 12.0–15.0)
Immature Granulocytes: 0 %
Lymphocytes Relative: 27 %
Lymphs Abs: 3.1 10*3/uL (ref 0.7–4.0)
MCH: 32.1 pg (ref 26.0–34.0)
MCHC: 33.4 g/dL (ref 30.0–36.0)
MCV: 96.1 fL (ref 80.0–100.0)
Monocytes Absolute: 0.8 10*3/uL (ref 0.1–1.0)
Monocytes Relative: 7 %
Neutro Abs: 7.5 10*3/uL (ref 1.7–7.7)
Neutrophils Relative %: 63 %
Platelets: 380 10*3/uL (ref 150–400)
RBC: 3.8 MIL/uL — ABNORMAL LOW (ref 3.87–5.11)
RDW: 14.1 % (ref 11.5–15.5)
WBC: 11.7 10*3/uL — ABNORMAL HIGH (ref 4.0–10.5)
nRBC: 0 % (ref 0.0–0.2)

## 2023-05-13 NOTE — Progress Notes (Signed)
 [facility] CONSULT NOTE  Patient Care Team: Hoy Register, MD as PCP - General (Family Medicine)  CHIEF COMPLAINTS/PURPOSE OF CONSULTATION:  Chronic leukocytosis.  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 Malignancy unlikely given chronicity and lack of significant changes. Smoking history may have contributed, but she has recently quit. - Order CBC with differential and peripheral smear. - Perform flow cytometry to assess for abnormal cells. - Conduct ANA test to evaluate for autoimmune conditions. - Advise follow-up based on test results.  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  - Coordinate HPV status monitoring with gynecologist.  Bipolar disorder Bipolar disorder managed with valproic acid and hydroxyzine. Experiences fatigue and hypersomnia, possibly related to disorder or medication.  Rachel Moulds MD   HISTORY OF PRESENTING ILLNESS:  Laura English 43 y.o. female is here because of leukocytosis.  Laura English is a 43 year old female with chronic leukocytosis who presents for evaluation of persistently high white blood cell count.  She has experienced persistently elevated white blood cell count for the past twelve years, with consistently higher than normal levels and slightly elevated absolute lymphocytes. There have been no significant changes in her condition over the years.  She experiences overwhelming fatigue, finding it difficult to get up some days and can sleep up to sixteen hours yet still feel tired. She is diagnosed with bipolar disorder and takes Depakote and Vistaril, which may contribute to her fatigue. The excessive sleeping is not a new symptom.  She has difficulty maintaining her weight, fluctuating between 110 and 120 pounds, with her ideal weight being 125  pounds. 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  She recently quit smoking cigarettes about a month ago and has switched to vaping. She tested positive for HPV in November. No significant family history of cancer or autoimmune diseases. She was tested for diabetes in November and was not even borderline.  No new joint pains or swellings but mentions numbness in her toes and occasional swelling in the pad of her big toe. She had her hand x-rayed last year due to finger problems. She rarely consumes alcohol.   All other systems were reviewed with the patient and are negative.  MEDICAL HISTORY:  Past Medical History:  Diagnosis Date   Depression    hx depression - no meds - no current problems   Heartburn in pregnancy    on pepcid   No pertinent past medical history     SURGICAL HISTORY: Past Surgical History:  Procedure Laterality Date   CESAREAN SECTION     3x   TUBAL LIGATION      SOCIAL HISTORY: Social History   Socioeconomic History   Marital status: Single    Spouse name: Not on file   Number of children: Not on file   Years of education: Not on file   Highest education level: Not on file  Occupational History   Not on file  Tobacco Use   Smoking status: Every Day    Current packs/day: 1.00    Average packs/day: 1 pack/day for 10.0 years (10.0 ttl pk-yrs)    Types: Cigarettes   Smokeless tobacco: Never   Tobacco comments:    e- cig  Substance and Sexual Activity   Alcohol  use: No   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   Sexual activity: Not Currently    Birth control/protection: None  Other Topics Concern   Not on file  Social History Narrative   Not on file   Social Drivers of Health   Financial Resource Strain: Medium Risk (01/20/2023)   Overall Financial Resource Strain (CARDIA)    Difficulty of Paying Living Expenses:  Somewhat hard  Food Insecurity: Food Insecurity Present (01/20/2023)   Hunger Vital Sign    Worried About Running Out of Food in the Last Year: Sometimes true    Ran Out of Food in the Last Year: Sometimes true  Transportation Needs: No Transportation Needs (01/20/2023)   PRAPARE - Administrator, Civil Service (Medical): No    Lack of Transportation (Non-Medical): No  Physical Activity: Inactive (01/20/2023)   Exercise Vital Sign    Days of Exercise per Week: 0 days    Minutes of Exercise per Session: 0 min  Stress: Stress Concern Present (01/20/2023)   [employer] of Occupational Health - Occupational Stress Questionnaire    Feeling of Stress : To some extent  Social Connections: Socially Isolated (01/20/2023)   Social Connection and Isolation Panel [NHANES]    Frequency of Communication with Friends and Family: Twice a week    Frequency of Social Gatherings with Friends and Family: Three times a week    Attends Religious Services: Never    Active Member of Clubs or Organizations: No    Attends Banker Meetings: Never    Marital Status: Divorced  Catering manager Violence: Not At Risk (01/20/2023)   Humiliation, Afraid, Rape, and Kick questionnaire    Fear of Current or Ex-Partner: No    Emotionally Abused: No    Physically Abused: No    Sexually Abused: No    FAMILY HISTORY: Family History  Problem Relation Age of Onset   Emphysema Mother    Dementia Maternal Grandfather    Alcohol abuse Father    Mental illness Father    Mental illness Brother    Diabetes Maternal Grandmother     ALLERGIES:  has no known allergies.  MEDICATIONS:  Current Outpatient Medications  Medication Sig Dispense Refill   divalproex (DEPAKOTE ER) 250 MG 24 hr tablet Take 1 tablet (250 mg total) by mouth at bedtime. 30 tablet 3   divalproex (DEPAKOTE ER) 500 MG 24 hr tablet Take 1 tablet (500 mg total) by mouth daily. 30 tablet 1   fluconazole (DIFLUCAN) 150 MG  tablet Take 1 tablet (150 mg total) by mouth daily. Take 1 tablet now and repeat in 3 days 2 tablet 0   hydrOXYzine (VISTARIL) 25 MG capsule Take 1 capsule (25 mg total) by mouth every 8 (eight) hours as needed. For anxiety.  Use sparingly 30 capsule 1   sulfamethoxazole-trimethoprim (BACTRIM DS) 800-160 MG tablet Take 1 tablet by mouth 2 (two) times daily. 14 tablet 0   No current facility-administered medications for this visit.     PHYSICAL EXAMINATION: ECOG PERFORMANCE STATUS: 0 - Asymptomatic  Vitals:   05/13/23 1415  BP: 119/78  Pulse: 86  Resp: 17  Temp: 98.5 F (36.9 C)  SpO2: 98%   Filed Weights   05/13/23 1415  Weight: 114 lb 4.8 oz (51.8 kg)    GENERAL:alert, no distress and comfortable SKIN: skin color, texture, turgor are normal, no rashes or significant lesions EYES: normal, conjunctiva  are pink and non-injected, sclera clear OROPHARYNX:no exudate, no erythema and lips, buccal mucosa, and tongue normal  NECK: supple, thyroid normal size, non-tender, without nodularity LYMPH:  no palpable lymphadenopathy in the cervical, axillary LUNGS: clear to auscultation and percussion with normal breathing effort HEART: regular rate & rhythm and no murmurs and no lower extremity edema ABDOMEN:abdomen soft, non-tender and normal bowel sounds Musculoskeletal:no cyanosis of digits and no clubbing  PSYCH: alert & oriented x 3 with fluent speech NEURO: no focal motor/sensory deficits  LABORATORY DATA:  I have reviewed the data as listed Lab Results  Component Value Date   WBC 11.7 (H) 03/26/2023   HGB 12.0 03/26/2023   HCT 36.5 03/26/2023   MCV 97 03/26/2023   PLT 416 03/26/2023     Chemistry      Component Value Date/Time   NA 142 01/22/2023 0848   K 4.9 01/22/2023 0848   CL 108 (H) 01/22/2023 0848   CO2 20 01/22/2023 0848   BUN 9 01/22/2023 0848   CREATININE 0.63 01/22/2023 0848   CREATININE 0.60 12/07/2012 1221      Component Value Date/Time   CALCIUM 9.0  01/22/2023 0848   ALKPHOS 76 01/22/2023 0848   AST 13 01/22/2023 0848   ALT 8 01/22/2023 0848   BILITOT <0.2 01/22/2023 0848       RADIOGRAPHIC STUDIES: I have personally reviewed the radiological images as listed and agreed with the findings in the report. No results found.  All questions were answered. The patient knows to call the clinic with any problems, questions or concerns. I spent 45 minutes in the care of this patient including H and P, review of records, counseling and coordination of care.     Rachel Moulds, MD 05/13/2023 2:26 PM

## 2023-05-14 LAB — FERRITIN: Ferritin: 9 ng/mL — ABNORMAL LOW (ref 11–307)

## 2023-05-14 LAB — SURGICAL PATHOLOGY

## 2023-05-15 LAB — FLOW CYTOMETRY

## 2023-05-20 LAB — ANTINUCLEAR ANTIBODIES, IFA: ANA Ab, IFA: NEGATIVE

## 2023-05-26 ENCOUNTER — Ambulatory Visit (INDEPENDENT_AMBULATORY_CARE_PROVIDER_SITE_OTHER): Admitting: Family Medicine

## 2023-05-26 ENCOUNTER — Encounter: Payer: Self-pay | Admitting: Family Medicine

## 2023-05-26 ENCOUNTER — Ambulatory Visit: Payer: Self-pay

## 2023-05-26 VITALS — BP 108/70 | HR 64 | Temp 97.5°F | Ht 62.0 in | Wt 117.0 lb

## 2023-05-26 DIAGNOSIS — D72829 Elevated white blood cell count, unspecified: Secondary | ICD-10-CM | POA: Diagnosis not present

## 2023-05-26 DIAGNOSIS — R309 Painful micturition, unspecified: Secondary | ICD-10-CM

## 2023-05-26 DIAGNOSIS — M545 Low back pain, unspecified: Secondary | ICD-10-CM | POA: Diagnosis not present

## 2023-05-26 DIAGNOSIS — R3915 Urgency of urination: Secondary | ICD-10-CM | POA: Diagnosis not present

## 2023-05-26 LAB — POCT URINALYSIS DIP (PROADVANTAGE DEVICE)
Bilirubin, UA: NEGATIVE
Blood, UA: NEGATIVE
Glucose, UA: NEGATIVE mg/dL
Ketones, POC UA: NEGATIVE mg/dL
Leukocytes, UA: NEGATIVE
Nitrite, UA: NEGATIVE
Protein Ur, POC: NEGATIVE mg/dL
Specific Gravity, Urine: 1.01
Urobilinogen, Ur: 0.2
pH, UA: 6.5 (ref 5.0–8.0)

## 2023-05-26 NOTE — Telephone Encounter (Signed)
 Chief Complaint: back pain originating after burning with urination and urgency Symptoms: see above Frequency: since Wednesday  Pertinent Negatives: Patient denies fever, blood in urine, pelvic pain, urine color change Disposition: [] ED /[] Urgent Care (no appt availability in office) / [x] Appointment(In office/virtual)/ []  [facility]/ [] Home Care/ [] Refused Recommended Disposition /[] [facility]/ []  Follow-up with PCP Additional Notes: Patient started having severe burning with urination and urgency on Wednesday and started taking cranberry supplements, UTI supplements and drinking extra water and stated those symptoms went away but she is now having severe flank pain. Patient denies fever, blood in urine, abdominal pain. Appt made for today for further evaluation to rule out UTI/Kidney infection.   Copied from CRM [id number]. Topic: Clinical - Red Word Triage >> May 26, 2023 10:35 AM Marland Kitchen D wrote: Patient thinks she has a UTI and having a lot of back pain that hasn't gotten better and wants to get tested Reason for Disposition  [1] Pain or burning with passing urine (urination) AND [2] flank pain (i.e., in side, below ribs and above hip)  Protocols used: Back Pain-A-AH

## 2023-05-26 NOTE — Progress Notes (Signed)
 Chief Complaint  Patient presents with   Urinary Frequency    Last month had a bad UTI, pain w/urination. Last week started again. B/L low back pain, urgency and pain when she urinates. Took some OTC UTI relief medicine.    PCP is at another practice, seeing for an acute visit  Triage nursing notes:  Patient started having severe burning with urination and urgency on Wednesday and started taking cranberry supplements, UTI supplements and drinking extra water and stated those symptoms went away but she is now having severe flank pain. Patient denies fever, blood in urine, abdominal pain. Appt made for today for further evaluation to rule out UTI/Kidney infection.   "I think I had a UTI last week, and now my back is bothering me". She had urgency, pain with voiding. Maybe it was a little cloudy, not bloody, odor maybe a little stronger than usual, not foul. She took OTC urinary relief med (azo?) and drank cranberry juice.  Voiding improved. Only has slight suprapubic discomfort at the end of voiding.  Back pain started Friday 3/28, across the middle of the back bilaterally.  Describes it as a constant ache, extends a little into the lower back. Doesn't hurt with coughing.  Hurts more if she hunches over.  She works as a Production assistant, radio at [employer]. Doesn't recall any injury/tweaking back lifting a heavy tray. Raked leaves the prior weekend, otherwise no change in activities.  She had back pain throughout the weekend--pain was a little better after walking at the park yesterday. Today is worse, hasn't noticed improvement since waking up.  Hasn't tried any OTC medications yet (because she didn't know what to treat). No aleve or tylenol, or heat/ice through the weekend.  Pain is 5-6/10 currently.  Last UTI was 02/2023. She was prescribed bactrim (and given diflucan). Culture grew Mixed urogenital flora, 25,000-50,000 cfu.  She recently quit smoking. Recently cut back on sodas. Working on drinking  more water.   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    divalproex (DEPAKOTE ER) 500 MG 24 hr tablet Take 1 tablet (500 mg total) by mouth daily.    Phenazopyridine HCl (URINARY PAIN RELIEF PO) Take by mouth. 05/26/2023: Last dose last week   fluconazole (DIFLUCAN) 150 MG tablet Take 1 tablet (150 mg total) by mouth daily. Take 1 tablet now and repeat in 3 days (Patient not taking: Reported on 05/26/2023)    hydrOXYzine (VISTARIL) 25 MG capsule Take 25 mg by mouth 3 (three) times daily as needed. (Patient not taking: Reported on 05/26/2023) 05/26/2023: As needed   No facility-administered encounter medications on file as of 05/26/2023.   No Known Allergies   ROS: no f/c, n/v. No HA, dizziness, chest pain. Some allergy issues recently. No numbness, weakness or tingling in the legs. Urinary symptoms improved, just some sporadic discomfort after voiding Some more chronic issues with fatigue.  PHYSICAL EXAM:  BP 108/70   Pulse 64   Temp (!) 97.5 F (36.4 C) (Tympanic)   Ht 5\' 2"  (1.575 m)   Wt 117 lb (53.1 kg)   LMP 05/12/2023   BMI 21.40 kg/m   Well-appearing, pleasant female in no acute distress HEENT: conjunctiva and  sclera are clear, EOMI Neck: no lymphadenopathy, thyromegaly or mass Heart: regular rate and rhythm Lungs: clear bilaterally Back: no spinal tenderness or SI tenderness. No CVA tenderness. She is tender at paraspinous muscles at upper lumbar back, R>L.  No significant spasm appreciable. Neuro: cranial nerves grossly intact. Normal strength, gait, negative SLR Extremities: no edema, 2+ pulses  Urine: SG 1.010, all  normal.   ASSESSMENT/PLAN:  Bilateral low back pain without sciatica, unspecified chronicity - Ddx reviewed. Felt to be muscular based on exam.  OTC NSAIDs, heat, massage, stretches. PT if not improving. - Plan: POCT Urinalysis DIP (Proadvantage Device)  Pain with urination - This has improved significantly. Encouraged continued hydration. Reviewed s/sx for which re-eval is indicated. - Plan: POCT Urinalysis DIP (Proadvantage Device)  Urinary urgency - this has improved.  Encouraged to continue to cut back on caffeine and drink more water. - Plan: POCT Urinalysis DIP (Proadvantage Device)  Leukocytosis, unspecified type - under care of hematologist. Reviewed last CBC, discussed eos likely related to allergies. f/u as planned  Congratulated on quitting smoking, and working on her overall health, including cutting back on soda and drinking more water.  I spent 37 minutes dedicated to the care of this patient, including pre-visit review of records, face to face time, post-visit ordering of testing and documentation.  Continue to try and drink more water (and less caffeinated beverages) Consider following up with your gynecologist if your pelvic discomfort doesn't resolve (or PCP if worsening urinary symptoms, blood in the urine).  Your back pain seems to be muscular (and not related to your kidneys). Try heat--either heating pad, how shower, or you can also use thermacare patches (especially while at work). Stretches can help your back, as well as some massage.  You can take 2 Aleve twice daily with food for 7-10 days. If your pain isn't improving with these measures, next step would be physical therapy. We briefly mentioned muscle relaxants if there is more spasm and isn't improving.  Do regular stretching (whole body), and core strengthening, to try and prevent problems in the future.  If you develop any pain that shoots down into the leg, especially with any numbness, tingling or  weakness, be sure  to get re-evaluated.

## 2023-05-26 NOTE — Patient Instructions (Signed)
  Continue to try and drink more water (and less caffeinated beverages) Consider following up with your gynecologist if your pelvic discomfort doesn't resolve (or PCP if worsening urinary symptoms, blood in the urine).  Your back pain seems to be muscular (and not related to your kidneys). Try heat--either heating pad, how shower, or you can also use thermacare patches (especially while at work). Stretches can help your back, as well as some massage.  You can take 2 Aleve twice daily with food for 7-10 days. If your pain isn't improving with these measures, next step would be physical therapy. We briefly mentioned muscle relaxants if there is more spasm and isn't improving.  Do regular stretching (whole body), and core strengthening, to try and prevent problems in the future.  If you develop any pain that shoots down into the leg, especially with any numbness, tingling or weakness, be sure  to get re-evaluated.

## 2023-05-27 NOTE — Progress Notes (Unsigned)
 [facility] CONSULT NOTE  Patient Care Team: Hoy Register, MD as PCP - General (Family Medicine)  CHIEF COMPLAINTS/PURPOSE OF CONSULTATION:  Chronic leukocytosis.  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 Malignancy unlikely given chronicity and lack of significant changes. Smoking history may have contributed, but she has recently quit. 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  - Coordinate HPV status monitoring with gynecologist.  Bipolar disorder Bipolar disorder managed with valproic acid and hydroxyzine. Experiences fatigue and hypersomnia, possibly related to disorder or medication.  Rachel Moulds MD   HISTORY OF PRESENTING ILLNESS:  Laura English 43 y.o. female is here because of leukocytosis.  Laura English is a 44 year old female with chronic leukocytosis who presents for evaluation of persistently high white blood cell count.  She has experienced persistently elevated white blood cell count for the past twelve years, with consistently higher than normal levels and slightly elevated absolute lymphocytes. There have been no significant changes in her condition over the years.  She experiences overwhelming fatigue, finding it difficult to get up some days and can sleep up to sixteen hours yet still feel tired. She is diagnosed with bipolar disorder and takes Depakote and Vistaril, which may contribute to her fatigue. The excessive sleeping is not a new symptom.  She has difficulty maintaining her weight, fluctuating between 110 and 120 pounds, with her ideal weight being 125 pounds. 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  She recently quit smoking cigarettes about a month ago and has  switched to vaping. She tested positive for HPV in November. No significant family history of cancer or autoimmune diseases. She was tested for diabetes in November and was not even borderline.  No new joint pains or swellings but mentions numbness in her toes and occasional swelling in the pad of her big toe. She had her hand x-rayed last year due to finger problems. She rarely consumes alcohol.   All other systems were reviewed with the patient and are negative.  MEDICAL HISTORY:  Past Medical History:  Diagnosis Date   Depression    hx depression - no meds - no current problems   Heartburn in pregnancy    on pepcid   No pertinent past medical history     SURGICAL HISTORY: Past Surgical History:  Procedure Laterality Date   CESAREAN SECTION     3x   TUBAL LIGATION      SOCIAL HISTORY: Social History   Socioeconomic History   Marital status: Single    Spouse name: Not on file   Number of children: Not on file   Years of education: Not on file   Highest education level: Not on file  Occupational History   Not on file  Tobacco Use   Smoking status: Every Day    Current packs/day: 1.00    Average packs/day: 1 pack/day for 10.0 years (10.0 ttl pk-yrs)    Types: Cigarettes   Smokeless tobacco: Never   Tobacco comments:    e- cig  Substance and Sexual Activity   Alcohol use: No   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   Sexual activity: Not Currently    Birth control/protection:  None  Other Topics Concern   Not on file  Social History Narrative   Not on file   Social Drivers of Health   Financial Resource Strain: Medium Risk (01/20/2023)   Overall Financial Resource Strain (CARDIA)    Difficulty of Paying Living Expenses: Somewhat hard  Food Insecurity: Food Insecurity Present (01/20/2023)   Hunger Vital Sign    Worried About Running Out of Food in the Last Year: Sometimes true    Ran Out of Food in the Last Year: Sometimes true   Transportation Needs: No Transportation Needs (01/20/2023)   PRAPARE - Administrator, Civil Service (Medical): No    Lack of Transportation (Non-Medical): No  Physical Activity: Inactive (01/20/2023)   Exercise Vital Sign    Days of Exercise per Week: 0 days    Minutes of Exercise per Session: 0 min  Stress: Stress Concern Present (01/20/2023)   [employer] of Occupational Health - Occupational Stress Questionnaire    Feeling of Stress : To some extent  Social Connections: Socially Isolated (01/20/2023)   Social Connection and Isolation Panel [NHANES]    Frequency of Communication with Friends and Family: Twice a week    Frequency of Social Gatherings with Friends and Family: Three times a week    Attends Religious Services: Never    Active Member of Clubs or Organizations: No    Attends Banker Meetings: Never    Marital Status: Divorced  Catering manager Violence: Not At Risk (01/20/2023)   Humiliation, Afraid, Rape, and Kick questionnaire    Fear of Current or Ex-Partner: No    Emotionally Abused: No    Physically Abused: No    Sexually Abused: No    FAMILY HISTORY: Family History  Problem Relation Age of Onset   Emphysema Mother    Dementia Maternal Grandfather    Alcohol abuse Father    Mental illness Father    Mental illness Brother    Diabetes Maternal Grandmother     ALLERGIES:  has no known allergies.  MEDICATIONS:  Current Outpatient Medications  Medication Sig Dispense Refill   divalproex (DEPAKOTE ER) 250 MG 24 hr tablet Take 1 tablet (250 mg total) by mouth at bedtime. 30 tablet 3   divalproex (DEPAKOTE ER) 500 MG 24 hr tablet Take 1 tablet (500 mg total) by mouth daily. 30 tablet 1   fluconazole (DIFLUCAN) 150 MG tablet Take 1 tablet (150 mg total) by mouth daily. Take 1 tablet now and repeat in 3 days (Patient not taking: Reported on 05/26/2023) 2 tablet 0   hydrOXYzine (VISTARIL) 25 MG capsule Take 25 mg by mouth 3 (three)  times daily as needed. (Patient not taking: Reported on 05/26/2023)     Phenazopyridine HCl (URINARY PAIN RELIEF PO) Take by mouth.     No current facility-administered medications for this visit.     PHYSICAL EXAMINATION: ECOG PERFORMANCE STATUS: 0 - Asymptomatic  There were no vitals filed for this visit.  There were no vitals filed for this visit.   GENERAL:alert, no distress and comfortable SKIN: skin color, texture, turgor are normal, no rashes or significant lesions EYES: normal, conjunctiva are pink and non-injected, sclera clear OROPHARYNX:no exudate, no erythema and lips, buccal mucosa, and tongue normal  NECK: supple, thyroid normal size, non-tender, without nodularity LYMPH:  no palpable lymphadenopathy in the cervical, axillary LUNGS: clear to auscultation and percussion with normal breathing effort HEART: regular rate & rhythm and no murmurs and no lower extremity edema ABDOMEN:abdomen  soft, non-tender and normal bowel sounds Musculoskeletal:no cyanosis of digits and no clubbing  PSYCH: alert & oriented x 3 with fluent speech NEURO: no focal motor/sensory deficits  LABORATORY DATA:  I have reviewed the data as listed Lab Results  Component Value Date   WBC 11.7 (H) 05/13/2023   HGB 12.2 05/13/2023   HCT 36.5 05/13/2023   MCV 96.1 05/13/2023   PLT 380 05/13/2023     Chemistry      Component Value Date/Time   NA 142 01/22/2023 0848   K 4.9 01/22/2023 0848   CL 108 (H) 01/22/2023 0848   CO2 20 01/22/2023 0848   BUN 9 01/22/2023 0848   CREATININE 0.63 01/22/2023 0848   CREATININE 0.60 12/07/2012 1221      Component Value Date/Time   CALCIUM 9.0 01/22/2023 0848   ALKPHOS 76 01/22/2023 0848   AST 13 01/22/2023 0848   ALT 8 01/22/2023 0848   BILITOT <0.2 01/22/2023 0848       RADIOGRAPHIC STUDIES: I have personally reviewed the radiological images as listed and agreed with the findings in the report. No results found.  All questions were answered.  The patient knows to call the clinic with any problems, questions or concerns. I spent 45 minutes in the care of this patient including H and P, review of records, counseling and coordination of care.     Rachel Moulds, MD 05/27/2023 7:22 PM

## 2023-05-28 ENCOUNTER — Inpatient Hospital Stay: Attending: Hematology and Oncology | Admitting: Hematology and Oncology

## 2023-05-28 DIAGNOSIS — D509 Iron deficiency anemia, unspecified: Secondary | ICD-10-CM | POA: Diagnosis not present

## 2023-05-29 ENCOUNTER — Telehealth: Payer: Self-pay | Admitting: Hematology and Oncology

## 2023-05-29 NOTE — Telephone Encounter (Signed)
 Spoke with pt about scheduled date and time.

## 2023-06-06 ENCOUNTER — Other Ambulatory Visit: Payer: Self-pay | Admitting: Physician Assistant

## 2023-06-06 ENCOUNTER — Telehealth: Payer: Self-pay

## 2023-06-06 ENCOUNTER — Other Ambulatory Visit: Payer: Self-pay

## 2023-06-06 MED ORDER — DIVALPROEX SODIUM ER 500 MG PO TB24
500.0000 mg | ORAL_TABLET | Freq: Every day | ORAL | 1 refills | Status: DC
  Filled 2023-06-06: qty 30, 30d supply, fill #0
  Filled 2023-07-16: qty 30, 30d supply, fill #1

## 2023-06-06 NOTE — Telephone Encounter (Signed)
 Routing to PCP for review.

## 2023-06-06 NOTE — Telephone Encounter (Signed)
 Patient requesting refill on divalproex (DEPAKOTE ER) 500 MG 24 hr tablet

## 2023-06-06 NOTE — Telephone Encounter (Signed)
 Looks like [employer] already.

## 2023-06-13 ENCOUNTER — Other Ambulatory Visit: Payer: Self-pay

## 2023-06-24 ENCOUNTER — Ambulatory Visit: Payer: BC Managed Care – PPO | Admitting: Family Medicine

## 2023-07-16 ENCOUNTER — Other Ambulatory Visit: Payer: Self-pay

## 2023-09-01 ENCOUNTER — Other Ambulatory Visit: Payer: Self-pay

## 2023-09-01 ENCOUNTER — Other Ambulatory Visit: Payer: Self-pay | Admitting: Family Medicine

## 2023-09-03 ENCOUNTER — Encounter: Payer: Self-pay | Admitting: Pharmacist

## 2023-09-03 ENCOUNTER — Other Ambulatory Visit: Payer: Self-pay

## 2023-09-10 ENCOUNTER — Other Ambulatory Visit: Payer: Self-pay

## 2023-09-18 ENCOUNTER — Other Ambulatory Visit: Payer: Self-pay

## 2023-09-18 ENCOUNTER — Ambulatory Visit: Payer: Self-pay

## 2023-09-18 ENCOUNTER — Emergency Department (HOSPITAL_COMMUNITY)

## 2023-09-18 ENCOUNTER — Emergency Department (HOSPITAL_COMMUNITY)
Admission: EM | Admit: 2023-09-18 | Discharge: 2023-09-18 | Disposition: A | Discharge status: Home / Self Care | Source: Ambulatory Visit | Attending: Emergency Medicine | Admitting: Emergency Medicine

## 2023-09-18 ENCOUNTER — Encounter (HOSPITAL_COMMUNITY): Payer: Self-pay | Admitting: *Deleted

## 2023-09-18 DIAGNOSIS — R102 Pelvic and perineal pain: Secondary | ICD-10-CM | POA: Diagnosis not present

## 2023-09-18 DIAGNOSIS — R42 Dizziness and giddiness: Secondary | ICD-10-CM | POA: Insufficient documentation

## 2023-09-18 DIAGNOSIS — D259 Leiomyoma of uterus, unspecified: Secondary | ICD-10-CM | POA: Insufficient documentation

## 2023-09-18 DIAGNOSIS — N939 Abnormal uterine and vaginal bleeding, unspecified: Secondary | ICD-10-CM | POA: Diagnosis not present

## 2023-09-18 DIAGNOSIS — R109 Unspecified abdominal pain: Secondary | ICD-10-CM | POA: Diagnosis not present

## 2023-09-18 DIAGNOSIS — N854 Malposition of uterus: Secondary | ICD-10-CM | POA: Diagnosis not present

## 2023-09-18 DIAGNOSIS — D219 Benign neoplasm of connective and other soft tissue, unspecified: Secondary | ICD-10-CM

## 2023-09-18 LAB — CBC WITH DIFFERENTIAL/PLATELET
Abs Immature Granulocytes: 0.03 K/uL (ref 0.00–0.07)
Basophils Absolute: 0.1 K/uL (ref 0.0–0.1)
Basophils Relative: 1 %
Eosinophils Absolute: 0.6 K/uL — ABNORMAL HIGH (ref 0.0–0.5)
Eosinophils Relative: 6 %
HCT: 43.8 % (ref 36.0–46.0)
Hemoglobin: 13.9 g/dL (ref 12.0–15.0)
Immature Granulocytes: 0 %
Lymphocytes Relative: 23 %
Lymphs Abs: 2.5 K/uL (ref 0.7–4.0)
MCH: 32.5 pg (ref 26.0–34.0)
MCHC: 31.7 g/dL (ref 30.0–36.0)
MCV: 102.3 fL — ABNORMAL HIGH (ref 80.0–100.0)
Monocytes Absolute: 0.7 K/uL (ref 0.1–1.0)
Monocytes Relative: 7 %
Neutro Abs: 6.9 K/uL (ref 1.7–7.7)
Neutrophils Relative %: 63 %
Platelets: 345 K/uL (ref 150–400)
RBC: 4.28 MIL/uL (ref 3.87–5.11)
RDW: 12.6 % (ref 11.5–15.5)
WBC: 10.8 K/uL — ABNORMAL HIGH (ref 4.0–10.5)
nRBC: 0 % (ref 0.0–0.2)

## 2023-09-18 LAB — COMPREHENSIVE METABOLIC PANEL WITH GFR
ALT: 12 U/L (ref 0–44)
AST: 21 U/L (ref 15–41)
Albumin: 3.7 g/dL (ref 3.5–5.0)
Alkaline Phosphatase: 58 U/L (ref 38–126)
Anion gap: 10 (ref 5–15)
BUN: 8 mg/dL (ref 6–20)
CO2: 23 mmol/L (ref 22–32)
Calcium: 9 mg/dL (ref 8.9–10.3)
Chloride: 105 mmol/L (ref 98–111)
Creatinine, Ser: 0.68 mg/dL (ref 0.44–1.00)
GFR, Estimated: >60 mL/min (ref >60)
Glucose, Bld: 99 mg/dL (ref 70–99)
Potassium: 3.9 mmol/L (ref 3.5–5.1)
Sodium: 138 mmol/L (ref 135–145)
Total Bilirubin: 0.5 mg/dL (ref 0.0–1.2)
Total Protein: 7.2 g/dL (ref 6.5–8.1)

## 2023-09-18 LAB — WET PREP, GENITAL
Clue Cells Wet Prep HPF POC: NONE SEEN
Sperm: NONE SEEN
Trich, Wet Prep: NONE SEEN
WBC, Wet Prep HPF POC: <10 (ref <10)
Yeast Wet Prep HPF POC: NONE SEEN

## 2023-09-18 LAB — TYPE AND SCREEN
ABO/RH(D): O POS
Antibody Screen: NEGATIVE

## 2023-09-18 LAB — URINALYSIS, ROUTINE W REFLEX MICROSCOPIC
Bilirubin Urine: NEGATIVE
Glucose, UA: NEGATIVE mg/dL
Ketones, ur: NEGATIVE mg/dL
Leukocytes,Ua: NEGATIVE
Nitrite: NEGATIVE
Protein, ur: NEGATIVE mg/dL
RBC / HPF: >50 RBC/hpf (ref 0–5)
Specific Gravity, Urine: 1.005 (ref 1.005–1.030)
pH: 5 (ref 5.0–8.0)

## 2023-09-18 LAB — RESP PANEL BY RT-PCR (RSV, FLU A&B, COVID)  RVPGX2
Influenza A by PCR: NEGATIVE
Influenza B by PCR: NEGATIVE
Resp Syncytial Virus by PCR: NEGATIVE
SARS Coronavirus 2 by RT PCR: NEGATIVE

## 2023-09-18 LAB — HCG, SERUM, QUALITATIVE: Preg, Serum: NEGATIVE

## 2023-09-18 MED ORDER — HYDROCODONE-ACETAMINOPHEN 5-325 MG PO TABS: 1.0000 | ORAL_TABLET | Freq: Four times a day (QID) | ORAL | 0 refills | Status: AC | PRN

## 2023-09-18 MED ORDER — ACETAMINOPHEN 325 MG PO TABS
650.0000 mg | ORAL_TABLET | Freq: Once | ORAL | Status: AC
  Administered 2023-09-18: 650 mg via ORAL
  Filled 2023-09-18: qty 2

## 2023-09-18 MED ORDER — SODIUM CHLORIDE 0.9 % IV BOLUS
1000.0000 mL | Freq: Once | INTRAVENOUS | Status: AC
  Administered 2023-09-18: 1000 mL via INTRAVENOUS

## 2023-09-18 MED ORDER — FENTANYL CITRATE PF 50 MCG/ML IJ SOSY
50.0000 ug | PREFILLED_SYRINGE | Freq: Once | INTRAMUSCULAR | Status: AC
  Administered 2023-09-18: 50 ug via INTRAVENOUS
  Filled 2023-09-18: qty 1

## 2023-09-18 NOTE — ED Triage Notes (Signed)
 Here by POV from home for heavy painful period with associated dizziness. Also endorses BLQ pain, and diarrhea. Low grade fever noted in triage. Denies sob or syncope. Onset of period 2d ago. Describes as heavy and painful. 4 pads in last 24 hrs. 1 pad in last hour. Dark with clots. H/o anemia and low iron. Alert, NAD, calm, steady gait, resps e/u.

## 2023-09-18 NOTE — Telephone Encounter (Signed)
 FYI Only or Action Required?: Action required by provider: request for appointment.  Patient was last seen in primary care on 05/26/2023 by Randol Dawes, MD.  Called Nurse Triage reporting Menstrual Problem.  Symptoms began yesterday.  Interventions attempted: OTC medications: ibuprofen .  Symptoms are: gradually worsening.  Triage Disposition: Go to ED Now (Notify PCP)  Patient/caregiver understands and will follow disposition?: YesCopied from CRM [id number]. Topic: Clinical - Red Word Triage >> Sep 18, 2023  8:12 AM Laura English wrote: Red Word that prompted transfer to Nurse Triage: patient called stating her period is on and it is super heavy, clotty and she has been hurting bad for two days. Patient stated she is hurting down to her leg and the color is darker than usual Reason for Disposition  SEVERE abdominal pain (e.g., excruciating)  Answer Assessment - Initial Assessment Questions 1. BLEEDING SEVERITY: Describe the bleeding that you are having. How much bleeding is there?       5 pads in day 2. ONSET: When did the bleeding begin? Is it continuing now?     2 days ago  3. MENSTRUAL PERIOD: When was the last normal menstrual period? How is this different than your period?     heavier 4. REGULARITY: How regular are your periods?     Normal from me  5. ABDOMEN PAIN: Do you have any pain? How bad is the pain?  (e.g., Scale 0-10; none, mild, moderate, or severe)     severe 6. PREGNANCY: Is there any chance you are pregnant? When was your last menstrual period?     no 7. BREASTFEEDING: Are you breastfeeding?     Na  8. HORMONE MEDICINES: Are you taking any hormone medicines, prescription or over-the-counter? (e.g., birth control pills, estrogen)     na 9. BLOOD THINNER MEDICINES: Do you take any blood thinners? (e.g., Coumadin / warfarin, Pradaxa / dabigatran, aspirin)     na 10. CAUSE: What do you think is causing the bleeding? (e.g., recent gyn surgery,  recent gyn procedure; known bleeding disorder, cervical cancer, polycystic ovarian disease, fibroids)         denies 11. HEMODYNAMIC STATUS: Are you weak or feeling lightheaded? If Yes, ask: Can you stand and walk normally?        weak 12. OTHER SYMPTOMS: What other symptoms are you having with the bleeding? (e.g., passed tissue, vaginal discharge, fever, menstrual-type cramps)       Blood clots and heavy cramping.   Blood is darker than normal. Clots fill bottom of toilet. Pt is going through pads and tampons every hour-2 hours. Pt is weak and hard to stand. RN advised ED and to call back and have f/u with PCP. Pt stated her roommate will take her.  Protocols used: Vaginal Bleeding - Abnormal-A-AH

## 2023-09-18 NOTE — ED Notes (Addendum)
 Remains in w/r. Pt moved in error.

## 2023-09-18 NOTE — Discharge Instructions (Signed)
 As we discussed, you have fibroids likely causing your bleeding and pain   Take Tylenol  or Motrin  for pain and Vicodin for severe pain  We have sent gonorrhea and chlamydia test and you should see the results on MyChart.  You will be called if your tests are positive  Please schedule an appointment with [facility] to discuss options for your fibroids.  As we discussed, you may want to consider hysterectomy if you have persistent bleeding  Return to ER if you have severe abdominal pain or vomiting or fever.

## 2023-09-18 NOTE — ED Provider Notes (Signed)
 [place] EMERGENCY DEPARTMENT AT [facility] Provider Note   CSN: [id number] Arrival date & time: 09/18/23  9051     Patient presents with: Dizziness   Laura English is a 43 y.o. female history of iron deficiency anemia, here presenting with abdominal pain and chills.  Patient states that she started her menses yesterday.  She states that she had a large amount of clots.  She states that this is worse than usual and she has worsening cramps.  She states that she had to use a pad and it was soaked.  Patient also has some subjective chills.  Denies any urinary symptoms.  She is sexually active and had unprotected sex about 2 weeks ago.  She thought she had a yeast infection so tried some over-the-counter medicines for yeast infection with no relief.  She also feels lightheaded and dizzy.  She also concerned that her iron level was low or she is anemic.   The history is provided by the patient.       Prior to Admission medications   Medication Sig Start Date End Date Taking? Authorizing Provider  divalproex  (DEPAKOTE  ER) 250 MG 24 hr tablet Take 1 tablet (250 mg total) by mouth at bedtime. 03/27/23   McClung, Angela M, PA-C  divalproex  (DEPAKOTE  ER) 500 MG 24 hr tablet Take 1 tablet (500 mg total) by mouth daily. 06/06/23   Newlin, Enobong, MD  fluconazole  (DIFLUCAN ) 150 MG tablet Take 1 tablet (150 mg total) by mouth daily. Take 1 tablet now and repeat in 3 days Patient not taking: Reported on 05/26/2023 03/26/23   Danton Jon CHRISTELLA, PA-C  hydrOXYzine  (VISTARIL ) 25 MG capsule Take 25 mg by mouth 3 (three) times daily as needed. Patient not taking: Reported on 05/26/2023    [provider]  Phenazopyridine  HCl (URINARY PAIN RELIEF PO) Take by mouth.    [provider]    Allergies: Patient has no known allergies.    Review of Systems  Gastrointestinal:  Positive for abdominal pain.  Neurological:  Positive for dizziness.  All other systems reviewed and  are negative.   Updated Vital Signs BP 102/70 (BP Location: Left Arm)   Pulse 75   Temp (!) 100.5 F (38.1 C) (Oral)   Resp 18   Wt 53.1 kg   LMP 09/16/2023 (Exact Date)   SpO2 100%   BMI 21.40 kg/m   Physical Exam Vitals and nursing note reviewed.  HENT:     Head: Normocephalic.     Nose: Nose normal.     Mouth/Throat:     Mouth: Mucous membranes are moist.  Eyes:     Extraocular Movements: Extraocular movements intact.     Pupils: Pupils are equal, round, and reactive to light.  Cardiovascular:     Rate and Rhythm: Normal rate and regular rhythm.     Pulses: Normal pulses.     Heart sounds: Normal heart sounds.  Pulmonary:     Effort: Pulmonary effort is normal.     Breath sounds: Normal breath sounds.  Abdominal:     General: Abdomen is flat.     Palpations: Abdomen is soft.     Comments: Mild bilateral lower abdominal tenderness.  No rebound.  Musculoskeletal:        General: Normal range of motion.     Cervical back: Normal range of motion and neck supple.  Skin:    General: Skin is warm.     Capillary Refill: Capillary refill takes less  than 2 seconds.  Neurological:     General: No focal deficit present.     Mental Status: She is alert and oriented to person, place, and time.  Psychiatric:        Mood and Affect: Mood normal.        Behavior: Behavior normal.     (all labs ordered are listed, but only abnormal results are displayed) Labs Reviewed  CBC WITH DIFFERENTIAL/PLATELET - Abnormal; Notable for the following components:      Result Value   WBC 10.8 (*)    MCV 102.3 (*)    Eosinophils Absolute 0.6 (*)    All other components within normal limits  URINALYSIS, ROUTINE W REFLEX MICROSCOPIC - Abnormal; Notable for the following components:   Color, Urine STRAW (*)    Hgb urine dipstick LARGE (*)    Bacteria, UA RARE (*)    All other components within normal limits  RESP PANEL BY RT-PCR (RSV, FLU A&B, COVID)  RVPGX2  WET PREP, GENITAL  HCG,  SERUM, QUALITATIVE  COMPREHENSIVE METABOLIC PANEL WITH GFR  TYPE AND SCREEN  GC/CHLAMYDIA PROBE AMP ([place]) NOT AT [facility]    EKG: None  Radiology: No results found.   Procedures   Medications Ordered in the ED  sodium chloride  0.9 % bolus 1,000 mL (has no administration in time range)  acetaminophen  (TYLENOL ) tablet 650 mg (has no administration in time range)  fentaNYL  (SUBLIMAZE ) injection 50 mcg (has no administration in time range)                                    Medical Decision Making Laura English is a 43 y.o. female here presenting with lower abdominal pain and vaginal bleeding.  I am concerned that she may have PID versus fibroids versus UTI versus viral syndrome.  Also consider anemia from the vaginal bleeding as well.  Plan to get CBC CMP and vaginal ultrasound and UA and COVID/flu/RSV.  Will hydrate and reassess.  5:25 PM Reviewed patient's labs and hemoglobin is stable.  COVID flu and RSV negative.  UA showed blood but no obvious UTI.  Ultrasound showed some fibroids which is likely causing her pain and bleeding.  Patient's pain is under control now.  She follows with [facility].  Will prescribe pain medicine and told her to call her GYN doctor for appointment to discuss about fibroids and possible hysterectomy  Problems Addressed: Fibroid: acute illness or injury Vaginal bleeding: acute illness or injury  Amount and/or Complexity of Data Reviewed Labs: ordered. Decision-making details documented in ED Course. Radiology: ordered and independent interpretation performed. Decision-making details documented in ED Course.  Risk OTC drugs. Prescription drug management.     Final diagnoses:  None    ED Discharge Orders     None          Patt Alm Macho, MD 09/18/23 [phone]

## 2023-09-19 ENCOUNTER — Ambulatory Visit: Payer: Self-pay

## 2023-09-19 LAB — GC/CHLAMYDIA PROBE AMP (~~LOC~~) NOT AT ARMC
Chlamydia: NEGATIVE
Comment: NEGATIVE
Comment: NORMAL
Neisseria Gonorrhea: NEGATIVE

## 2023-09-19 NOTE — Telephone Encounter (Signed)
 Spoke with patient. Patient is call n [facility] to obtain an appointment.

## 2023-09-19 NOTE — Telephone Encounter (Signed)
 FYI Only or Action Required?: Action required by provider: request for appointment.: pt will need ED f/u: none available with PCP until 10/2023  Patient was last seen in primary care on 05/26/2023 by Laura Dawes, MD.  Called Nurse Triage reporting Abdominal Pain.  Symptoms began yesterday.  Interventions attempted: OTC medications: pain medication rx- from ED.  Symptoms are: gradually improving.  Triage Disposition: See PCP Within 2 Weeks  Patient/caregiver understands and will follow disposition?: Yes   Copied from CRM [id number]. Topic: Clinical - Red Word Triage >> Sep 19, 2023  1:37 PM Laura English wrote: [facility] that prompted transfer to Nurse Triage: Received call from patient, states she was seen in ER at [facility] on 09/18/23, per patient states she is still having abdominal pain is worsening, not feeling any better, and having cramping, weakness. Reason for Disposition  Abdominal pain is a chronic symptom (recurrent or ongoing AND present > 4 weeks)  Answer Assessment - Initial Assessment Questions 1. LOCATION: Where does it hurt?      Abd pain  2. RADIATION: Does the pain shoot anywhere else? (e.g., chest, back)     no 3. ONSET: When did the pain begin? (e.g., minutes, hours or days ago)      Ongoing 4. SUDDEN: Gradual or sudden onset?     gradual 5. PATTERN Does the pain come and go, or is it constant?     constant 6. SEVERITY: How bad is the pain?  (e.g., Scale 1-10; mild, moderate, or severe)     Mild to moderate 7. RECURRENT SYMPTOM: Have you ever had this type of stomach pain before? If Yes, ask: When was the last time? and What happened that time?      no 8. CAUSE: What do you think is causing the stomach pain? (e.g., gallstones, recent abdominal surgery)     na 9. RELIEVING/AGGRAVATING FACTORS: What makes it better or worse? (e.g., antacids, bending or twisting motion, bowel movement)     na 10. OTHER SYMPTOMS: Do you have any other symptoms?  (e.g., back pain, diarrhea, fever, urination pain, vomiting)       na 11. PREGNANCY: Is there any chance you are pregnant? When was your last menstrual period?       Na  Fibroids - diagnosis at ED  Pt needs ED f/u: no appts with PCP until 10/2023: will route to office  Protocols used: Abdominal Pain - [facility]

## 2023-09-26 ENCOUNTER — Encounter: Payer: Self-pay | Admitting: Obstetrics and Gynecology

## 2023-09-26 ENCOUNTER — Ambulatory Visit: Admitting: Obstetrics and Gynecology

## 2023-09-26 ENCOUNTER — Other Ambulatory Visit: Payer: Self-pay

## 2023-09-26 VITALS — BP 108/68 | HR 62 | Temp 97.6°F

## 2023-09-26 DIAGNOSIS — N946 Dysmenorrhea, unspecified: Secondary | ICD-10-CM

## 2023-09-26 MED ORDER — NORETHINDRONE ACETATE 5 MG PO TABS
5.0000 mg | ORAL_TABLET | Freq: Every day | ORAL | 3 refills | Status: DC
  Filled 2023-09-26: qty 30, 30d supply, fill #0
  Filled 2023-10-31: qty 30, 30d supply, fill #1
  Filled 2023-11-28: qty 30, 30d supply, fill #2
  Filled 2023-12-16: qty 30, 30d supply, fill #3

## 2023-09-26 NOTE — Progress Notes (Signed)
 43 y.o. [id number] female with abnormal PAP, tobacco use, prior CDx3, h/o substance abuse here for consultation. Single.  Patient's last menstrual period was 09/16/2023 (exact date). Period Cycle (Days): 25 Period Duration (Days): 5-7 Period Pattern: Regular Menstrual Flow: Heavy Menstrual Control: Maxi pad, Tampon Dysmenorrhea: (!) Severe Dysmenorrhea Symptoms: Cramping, Diarrhea, Other (Comment), Headache (Faitgue)  She reports she went to ED 09/18/23 for severe menstrual cramping and states they told her she needed a hysterectomy because she has fibroids. She does not think she has fibroids, she thinks she has endometriosis.  She has severe menstrual cramping that radiates down her legs.  She sometimes misses work due to this pain. She is not having any pain or bleeding today. Her pain does not occur outside of her periods.  Quit smoking cigarettes in April, now vaping daily.  12/25/22 ASCUS, HPV pos 09/18/2023 TVUS unremarkable, ?adenomyosis 09/18/23 Hgb 13.9, Plt 345 Seeing H/O for leukocytosis, iron deficiency  Birth control: BTL  CD x 3, BTL  OB History  Gravida Para Term Preterm AB Living  3 3 3   3   SAB IAB Ectopic Multiple Live Births      1    # Outcome Date GA Lbr Len/2nd Weight Sex Type Anes PTL Lv  3 Term 10/30/10 [redacted]w[redacted]d  5 lb 8.7 oz (2.515 kg) F CS-LTranv Spinal  LIV  2 Term           1 Term            Past Medical History:  Diagnosis Date   Depression    hx depression - no meds - no current problems   Heartburn in pregnancy    on pepcid   No pertinent past medical history    Past Surgical History:  Procedure Laterality Date   CESAREAN SECTION     3x   TUBAL LIGATION     Current Outpatient Medications on File Prior to Visit  Medication Sig Dispense Refill   divalproex  (DEPAKOTE  ER) 250 MG 24 hr tablet Take 1 tablet (250 mg total) by mouth at bedtime. 30 tablet 3   divalproex  (DEPAKOTE  ER) 500 MG 24 hr tablet Take 1 tablet (500 mg total) by mouth  daily. 30 tablet 1   HYDROcodone -acetaminophen  (NORCO/VICODIN) 5-325 MG tablet Take 1 tablet by mouth every 6 (six) hours as needed. 10 tablet 0   hydrOXYzine  (VISTARIL ) 25 MG capsule Take 25 mg by mouth 3 (three) times daily as needed.     fluconazole  (DIFLUCAN ) 150 MG tablet Take 1 tablet (150 mg total) by mouth daily. Take 1 tablet now and repeat in 3 days (Patient not taking: Reported on 09/26/2023) 2 tablet 0   Phenazopyridine  HCl (URINARY PAIN RELIEF PO) Take by mouth. (Patient not taking: Reported on 09/26/2023)     No current facility-administered medications on file prior to visit.   No Known Allergies    PE Today's Vitals   09/26/23 1030  BP: 108/68  Pulse: 62  Temp: 97.6 F (36.4 C)  SpO2: 98%   There is no height or weight on file to calculate BMI.  Physical Exam Vitals reviewed.  Constitutional:      General: She is not in acute distress.    Appearance: Normal appearance.  HENT:     Head: Normocephalic and atraumatic.     Nose: Nose normal.  Eyes:     Extraocular Movements: Extraocular movements intact.     Conjunctiva/sclera: Conjunctivae normal.  Pulmonary:     Effort: Pulmonary effort  is normal.  Musculoskeletal:        General: Normal range of motion.     Cervical back: Normal range of motion.  Neurological:     General: No focal deficit present.     Mental Status: She is alert.  Psychiatric:        Mood and Affect: Mood normal.        Behavior: Behavior normal.      Assessment and Plan:        Dysmenorrhea -     Norethindrone  Acetate; Take 1 tablet (5 mg total) by mouth daily.  Dispense: 90 tablet; Refill: 3  Patient reporting severe dysmenorrhea. She is currently managing with OTC meds.  Ultrasound reviewed: Possible adenomyosis.  Diagnosis reviewed with patient Discussed initial management, including NSAIDS and hormonal therapy.  Also discussed hysterectomy.  Patient is not interested in surgical management at this time. Recommend continue with  Aygestin  therapy. RTO for annual exam in October or sooner if needed.  Will reassess symptoms at that time   Vera LULLA Pa, MD

## 2023-09-29 ENCOUNTER — Telehealth: Payer: Self-pay

## 2023-09-29 NOTE — Telephone Encounter (Signed)
 Pt verbally confirmed appt for 8/5

## 2023-09-30 ENCOUNTER — Inpatient Hospital Stay: Attending: Hematology and Oncology

## 2023-09-30 ENCOUNTER — Inpatient Hospital Stay (HOSPITAL_BASED_OUTPATIENT_CLINIC_OR_DEPARTMENT_OTHER): Admitting: Hematology and Oncology

## 2023-09-30 VITALS — BP 99/60 | HR 62 | Temp 98.2°F | Resp 16 | Wt 123.6 lb

## 2023-09-30 DIAGNOSIS — D509 Iron deficiency anemia, unspecified: Secondary | ICD-10-CM

## 2023-09-30 DIAGNOSIS — N920 Excessive and frequent menstruation with regular cycle: Secondary | ICD-10-CM | POA: Insufficient documentation

## 2023-09-30 DIAGNOSIS — Z87891 Personal history of nicotine dependence: Secondary | ICD-10-CM | POA: Insufficient documentation

## 2023-09-30 DIAGNOSIS — Z79899 Other long term (current) drug therapy: Secondary | ICD-10-CM | POA: Insufficient documentation

## 2023-09-30 DIAGNOSIS — N8003 Adenomyosis of the uterus: Secondary | ICD-10-CM | POA: Diagnosis not present

## 2023-09-30 DIAGNOSIS — D72829 Elevated white blood cell count, unspecified: Secondary | ICD-10-CM | POA: Insufficient documentation

## 2023-09-30 DIAGNOSIS — D259 Leiomyoma of uterus, unspecified: Secondary | ICD-10-CM | POA: Insufficient documentation

## 2023-09-30 DIAGNOSIS — F319 Bipolar disorder, unspecified: Secondary | ICD-10-CM | POA: Diagnosis not present

## 2023-09-30 DIAGNOSIS — E611 Iron deficiency: Secondary | ICD-10-CM | POA: Insufficient documentation

## 2023-09-30 LAB — CBC WITH DIFFERENTIAL/PLATELET
Abs Immature Granulocytes: 0.03 K/uL (ref 0.00–0.07)
Basophils Absolute: 0.1 K/uL (ref 0.0–0.1)
Basophils Relative: 1 %
Eosinophils Absolute: 0.3 K/uL (ref 0.0–0.5)
Eosinophils Relative: 3 %
HCT: 39.5 % (ref 36.0–46.0)
Hemoglobin: 13.4 g/dL (ref 12.0–15.0)
Immature Granulocytes: 0 %
Lymphocytes Relative: 32 %
Lymphs Abs: 3.5 K/uL (ref 0.7–4.0)
MCH: 33.2 pg (ref 26.0–34.0)
MCHC: 33.9 g/dL (ref 30.0–36.0)
MCV: 97.8 fL (ref 80.0–100.0)
Monocytes Absolute: 0.5 K/uL (ref 0.1–1.0)
Monocytes Relative: 5 %
Neutro Abs: 6.5 K/uL (ref 1.7–7.7)
Neutrophils Relative %: 59 %
Platelets: 309 K/uL (ref 150–400)
RBC: 4.04 MIL/uL (ref 3.87–5.11)
RDW: 12.1 % (ref 11.5–15.5)
WBC: 10.9 K/uL — ABNORMAL HIGH (ref 4.0–10.5)
nRBC: 0 % (ref 0.0–0.2)

## 2023-09-30 LAB — IRON AND IRON BINDING CAPACITY (CC-WL,HP ONLY)
Iron: 108 ug/dL (ref 28–170)
Saturation Ratios: 34 % — ABNORMAL HIGH (ref 10.4–31.8)
TIBC: 319 ug/dL (ref 250–450)
UIBC: 211 ug/dL (ref 148–442)

## 2023-09-30 LAB — FERRITIN: Ferritin: 51 ng/mL (ref 11–307)

## 2023-09-30 NOTE — Progress Notes (Signed)
 [facility] CONSULT NOTE  Patient Care Team: Delbert Clam, MD as PCP - General (Family Medicine)  CHIEF COMPLAINTS/PURPOSE OF CONSULTATION:  Chronic leukocytosis.  ASSESSMENT & PLAN:   Assessment and Plan Assessment & Plan Heavy menstrual bleeding due to uterine fibroids and adenomyosis Heavy menstrual bleeding secondary to uterine fibroids and adenomyosis. Hormonal therapy with norethindrone  initiated to reduce bleeding. She prefers to avoid hysterectomy. - Monitor for improvement in symptoms over 90 days. - Consider surgical intervention if no improvement with hormonal therapy per gynecology  Iron deficiency without anemia Iron deficiency without anemia with hemoglobin at 13.4. Iron panel drawn to assess iron stores. Reports improved energy but quick depletion. Dietary modifications made to support iron levels. - Continue iron supplementation and multivitamin daily. - Review iron panel results once available. - Consider IV iron if ferritin levels remain very low.  We will continue to monitor her labs, CBC, Iron panel and ferritin planned.  Amber Stalls MD   HISTORY OF PRESENTING ILLNESS:  Laura English 43 y.o. female is here because of leukocytosis.  Discussed the use of AI scribe software for clinical note transcription with the patient, who gave verbal consent to proceed.  History of Present Illness Laura English is a 43 year old female with fibroids and adenomyosis who presents with heavy menstrual bleeding and pain.  She has been experiencing heavy menstrual bleeding and severe pain, which led to an emergency room visit two weeks ago. During that visit, she was diagnosed with fibroids and adenomyosis. She has not yet started hormone therapy due to financial constraints.  There is a family history of fibroids, with many women on her mother's side having undergone hysterectomies in their forties due to this condition. She is considering  hormone therapy as an alternative to surgery, given her upcoming move to [place]  and the associated recovery time from surgery.  She is currently taking iron supplements and a multivitamin daily, although she occasionally misses a dose. She reports an increase in energy levels, although they deplete quickly. She is also trying to improve her diet by eating more green salads and red meat to support her iron levels.  She is concerned about the potential interaction between the hormone therapy (Aygestin , 5 mg) and her mood stabilizer, Depakote , which she takes for bipolar disorder. She is particularly worried about the impact on her mood stability.  No other bleeding, such as blood in her stool or urine, and no other significant changes in her health. She has recently quit smoking cigarettes.     MEDICAL HISTORY:  Past Medical History:  Diagnosis Date   Depression    hx depression - no meds - no current problems   Heartburn in pregnancy    on pepcid   No pertinent past medical history     SURGICAL HISTORY: Past Surgical History:  Procedure Laterality Date   CESAREAN SECTION     3x   TUBAL LIGATION      SOCIAL HISTORY: Social History   Socioeconomic History   Marital status: Single    Spouse name: Not on file   Number of children: Not on file   Years of education: Not on file   Highest education level: Not on file  Occupational History   Not on file  Tobacco Use   Smoking status: Every Day    Current packs/day: 1.00    Average packs/day: 1 pack/day for 10.0 years (10.0 ttl pk-yrs)    Types: E-cigarettes, Cigarettes   Smokeless  tobacco: Never   Tobacco comments:    e- cig  Substance and Sexual Activity   Alcohol use: No   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   Sexual activity: Not Currently    Birth control/protection: None  Other Topics Concern   Not on file  Social History Narrative   Not on file   Social Drivers of Health    Financial Resource Strain: Medium Risk (01/20/2023)   Overall Financial Resource Strain (CARDIA)    Difficulty of Paying Living Expenses: Somewhat hard  Food Insecurity: Food Insecurity Present (01/20/2023)   Hunger Vital Sign    Worried About Running Out of Food in the Last Year: Sometimes true    Ran Out of Food in the Last Year: Sometimes true  Transportation Needs: No Transportation Needs (01/20/2023)   PRAPARE - Administrator, Civil Service (Medical): No    Lack of Transportation (Non-Medical): No  Physical Activity: Inactive (01/20/2023)   Exercise Vital Sign    Days of Exercise per Week: 0 days    Minutes of Exercise per Session: 0 min  Stress: Stress Concern Present (01/20/2023)   [employer] of Occupational Health - Occupational Stress Questionnaire    Feeling of Stress : To some extent  Social Connections: Socially Isolated (01/20/2023)   Social Connection and Isolation Panel    Frequency of Communication with Friends and Family: Twice a week    Frequency of Social Gatherings with Friends and Family: Three times a week    Attends Religious Services: Never    Active Member of Clubs or Organizations: No    Attends Banker Meetings: Never    Marital Status: Divorced  Catering manager Violence: Not At Risk (01/20/2023)   Humiliation, Afraid, Rape, and Kick questionnaire    Fear of Current or Ex-Partner: No    Emotionally Abused: No    Physically Abused: No    Sexually Abused: No    FAMILY HISTORY: Family History  Problem Relation Age of Onset   Emphysema Mother    Fibroids Mother    Alcohol abuse Father    Mental illness Father    Mental illness Brother    Fibroids Maternal Aunt    Diabetes Maternal Grandmother    Fibroids Maternal Grandmother    Dementia Maternal Grandfather     ALLERGIES:  has no known allergies.  MEDICATIONS:  Current Outpatient Medications  Medication Sig Dispense Refill   divalproex  (DEPAKOTE  ER) 250  MG 24 hr tablet Take 1 tablet (250 mg total) by mouth at bedtime. 30 tablet 3   divalproex  (DEPAKOTE  ER) 500 MG 24 hr tablet Take 1 tablet (500 mg total) by mouth daily. 30 tablet 1   fluconazole  (DIFLUCAN ) 150 MG tablet Take 1 tablet (150 mg total) by mouth daily. Take 1 tablet now and repeat in 3 days (Patient not taking: Reported on 09/26/2023) 2 tablet 0   HYDROcodone -acetaminophen  (NORCO/VICODIN) 5-325 MG tablet Take 1 tablet by mouth every 6 (six) hours as needed. 10 tablet 0   hydrOXYzine  (VISTARIL ) 25 MG capsule Take 25 mg by mouth 3 (three) times daily as needed.     norethindrone  (AYGESTIN ) 5 MG tablet Take 1 tablet (5 mg total) by mouth daily. 90 tablet 3   Phenazopyridine  HCl (URINARY PAIN RELIEF PO) Take by mouth. (Patient not taking: Reported on 09/26/2023)     No current facility-administered medications for this visit.  PHYSICAL EXAMINATION: ECOG PERFORMANCE STATUS: 0 - Asymptomatic  Vitals:   09/30/23 1316  BP: 99/60  Pulse: 62  Resp: 16  Temp: 98.2 F (36.8 C)  SpO2: 100%    Filed Weights   09/30/23 1316  Weight: 123 lb 9.6 oz (56.1 kg)    Physical Exam Constitutional:      Appearance: Normal appearance.  Cardiovascular:     Rate and Rhythm: Normal rate and regular rhythm.     Pulses: Normal pulses.     Heart sounds: Normal heart sounds.  Pulmonary:     Effort: Pulmonary effort is normal.     Breath sounds: Normal breath sounds.  Musculoskeletal:        General: Normal range of motion.     Cervical back: Normal range of motion and neck supple. No rigidity.  Lymphadenopathy:     Cervical: No cervical adenopathy.  Skin:    General: Skin is warm and dry.  Neurological:     General: No focal deficit present.     Mental Status: She is alert.  Psychiatric:        Mood and Affect: Mood normal.      LABORATORY DATA:  I have reviewed the data as listed Lab Results  Component Value Date   WBC 10.9 (H) 09/30/2023   HGB 13.4 09/30/2023   HCT 39.5  09/30/2023   MCV 97.8 09/30/2023   PLT 309 09/30/2023     Chemistry      Component Value Date/Time   NA 138 09/18/2023 1007   NA 142 01/22/2023 0848   K 3.9 09/18/2023 1007   CL 105 09/18/2023 1007   CO2 23 09/18/2023 1007   BUN 8 09/18/2023 1007   BUN 9 01/22/2023 0848   CREATININE 0.68 09/18/2023 1007   CREATININE 0.60 12/07/2012 1221      Component Value Date/Time   CALCIUM 9.0 09/18/2023 1007   ALKPHOS 58 09/18/2023 1007   AST 21 09/18/2023 1007   ALT 12 09/18/2023 1007   BILITOT 0.5 09/18/2023 1007   BILITOT <0.2 01/22/2023 0848       RADIOGRAPHIC STUDIES: I have personally reviewed the radiological images as listed and agreed with the findings in the report. US  PELVIC COMPLETE W TRANSVAGINAL AND TORSION R/O Result Date: 09/18/2023 CLINICAL DATA:  Pelvic pain. EXAM: TRANSABDOMINAL AND TRANSVAGINAL ULTRASOUND OF PELVIS DOPPLER ULTRASOUND OF OVARIES TECHNIQUE: Both transabdominal and transvaginal ultrasound examinations of the pelvis were performed. Transabdominal technique was performed for global imaging of the pelvis including uterus, ovaries, adnexal regions, and pelvic cul-de-sac. It was necessary to proceed with endovaginal exam following the transabdominal exam to visualize the right and left ovary. Color and duplex Doppler ultrasound was utilized to evaluate blood flow to the ovaries. COMPARISON:  Pelvic ultrasound 11/30/2020 FINDINGS: Uterus Measurements: 9.1 x 3.8 x 5 cm = volume: 90 mL. The uterus is anteverted. Myometrium is heterogeneous. No fibroids or other mass visualized. Endometrium Thickness: 4 mm. The endometrial myometrial junction is poorly defined. No focal abnormality visualized. Right ovary Measurements: 1.6 x 2 x 1.4 cm = volume: 2.4 mL. Normal appearance. Few physiologic follicles. Normal blood flow. No adnexal mass. Left ovary Measurements: 2 x 1.9 x 1.5 cm = volume: 2.8 mL. Normal appearance. Physiologic follicles. Normal blood flow. No adnexal mass.  Pulsed Doppler evaluation of both ovaries demonstrates normal low-resistance arterial and venous waveforms. Other findings No abnormal free fluid. IMPRESSION: 1. Normal blood flow to both ovaries, no ovarian torsion. 2. Poorly defined myometrial endometrial  junction is nonspecific but can be seen in the setting of adenomyosis. Electronically Signed   By: Andrea Gasman M.D.   On: 09/18/2023 17:04      Amber Stalls, MD 09/30/2023 3:42 PM

## 2023-09-30 NOTE — Progress Notes (Deleted)
 [facility] CONSULT NOTE  Patient Care Team: Delbert Clam, MD as PCP - General (Family Medicine)  CHIEF COMPLAINTS/PURPOSE OF CONSULTATION:  Chronic leukocytosis.  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  Most recent labs with mild leukocytosis, no lymphocytosis,    We will continue to monitor her labs, CBC, Iron panel and ferritin planned.  Amber Stalls MD   HISTORY OF PRESENTING ILLNESS:  Laura English 43 y.o. female is here because of leukocytosis.     MEDICAL HISTORY:  Past Medical History:  Diagnosis Date   Depression    hx depression - no meds - no current problems   Heartburn in pregnancy    on pepcid   No pertinent past medical history     SURGICAL HISTORY: Past Surgical History:  Procedure Laterality Date   CESAREAN SECTION     3x   TUBAL LIGATION      SOCIAL HISTORY: Social History   Socioeconomic History   Marital status: Single    Spouse name: Not on file   Number of children: Not on file   Years of education: Not on file   Highest education level: Not on file  Occupational History   Not on file  Tobacco Use   Smoking status: Every Day    Current packs/day: 1.00    Average packs/day: 1 pack/day for 10.0 years (10.0 ttl pk-yrs)    Types: E-cigarettes, Cigarettes   Smokeless tobacco: Never   Tobacco comments:    e- cig  Substance and Sexual Activity   Alcohol use: No   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   Sexual activity: Not Currently    Birth control/protection: None  Other Topics Concern   Not on file  Social History Narrative   Not on file   Social Drivers of Health   Financial Resource Strain: Medium Risk (01/20/2023)   Overall Financial Resource Strain (CARDIA)    Difficulty of Paying Living Expenses: Somewhat hard  Food Insecurity: Food  Insecurity Present (01/20/2023)   Hunger Vital Sign    Worried About Running Out of Food in the Last Year: Sometimes true    Ran Out of Food in the Last Year: Sometimes true  Transportation Needs: No Transportation Needs (01/20/2023)   PRAPARE - Administrator, Civil Service (Medical): No    Lack of Transportation (Non-Medical): No  Physical Activity: Inactive (01/20/2023)   Exercise Vital Sign    Days of Exercise per Week: 0 days    Minutes of Exercise per Session: 0 min  Stress: Stress Concern Present (01/20/2023)   [employer] of Occupational Health - Occupational Stress Questionnaire    Feeling of Stress : To some extent  Social Connections: Socially Isolated (01/20/2023)   Social Connection and Isolation Panel    Frequency of Communication with Friends and Family: Twice a week    Frequency of Social Gatherings with Friends and Family: Three times a week    Attends Religious Services: Never    Active Member of Clubs or Organizations: No    Attends Banker Meetings: Never    Marital Status: Divorced  Catering manager Violence: Not At Risk (01/20/2023)   Humiliation, Afraid, Rape, and Kick questionnaire    Fear of Current or Ex-Partner: No    Emotionally Abused: No    Physically Abused:  No    Sexually Abused: No    FAMILY HISTORY: Family History  Problem Relation Age of Onset   Emphysema Mother    Fibroids Mother    Alcohol abuse Father    Mental illness Father    Mental illness Brother    Fibroids Maternal Aunt    Diabetes Maternal Grandmother    Fibroids Maternal Grandmother    Dementia Maternal Grandfather     ALLERGIES:  has no known allergies.  MEDICATIONS:  Current Outpatient Medications  Medication Sig Dispense Refill   divalproex  (DEPAKOTE  ER) 250 MG 24 hr tablet Take 1 tablet (250 mg total) by mouth at bedtime. 30 tablet 3   divalproex  (DEPAKOTE  ER) 500 MG 24 hr tablet Take 1 tablet (500 mg total) by mouth daily. 30 tablet 1    fluconazole  (DIFLUCAN ) 150 MG tablet Take 1 tablet (150 mg total) by mouth daily. Take 1 tablet now and repeat in 3 days (Patient not taking: Reported on 09/26/2023) 2 tablet 0   HYDROcodone -acetaminophen  (NORCO/VICODIN) 5-325 MG tablet Take 1 tablet by mouth every 6 (six) hours as needed. 10 tablet 0   hydrOXYzine  (VISTARIL ) 25 MG capsule Take 25 mg by mouth 3 (three) times daily as needed.     norethindrone  (AYGESTIN ) 5 MG tablet Take 1 tablet (5 mg total) by mouth daily. 90 tablet 3   Phenazopyridine  HCl (URINARY PAIN RELIEF PO) Take by mouth. (Patient not taking: Reported on 09/26/2023)     No current facility-administered medications for this visit.     PHYSICAL EXAMINATION: ECOG PERFORMANCE STATUS: 0 - Asymptomatic  Vitals:   09/30/23 1316  BP: 99/60  Pulse: 62  Resp: 16  Temp: 98.2 F (36.8 C)  SpO2: 100%    Filed Weights   09/30/23 1316  Weight: 123 lb 9.6 oz (56.1 kg)     PE deferred, telephone visit  LABORATORY DATA:  I have reviewed the data as listed Lab Results  Component Value Date   WBC 10.9 (H) 09/30/2023   HGB 13.4 09/30/2023   HCT 39.5 09/30/2023   MCV 97.8 09/30/2023   PLT 309 09/30/2023     Chemistry      Component Value Date/Time   NA 138 09/18/2023 1007   NA 142 01/22/2023 0848   K 3.9 09/18/2023 1007   CL 105 09/18/2023 1007   CO2 23 09/18/2023 1007   BUN 8 09/18/2023 1007   BUN 9 01/22/2023 0848   CREATININE 0.68 09/18/2023 1007   CREATININE 0.60 12/07/2012 1221      Component Value Date/Time   CALCIUM 9.0 09/18/2023 1007   ALKPHOS 58 09/18/2023 1007   AST 21 09/18/2023 1007   ALT 12 09/18/2023 1007   BILITOT 0.5 09/18/2023 1007   BILITOT <0.2 01/22/2023 0848       RADIOGRAPHIC STUDIES: I have personally reviewed the radiological images as listed and agreed with the findings in the report. US  PELVIC COMPLETE W TRANSVAGINAL AND TORSION R/O Result Date: 09/18/2023 CLINICAL DATA:  Pelvic pain. EXAM: TRANSABDOMINAL AND  TRANSVAGINAL ULTRASOUND OF PELVIS DOPPLER ULTRASOUND OF OVARIES TECHNIQUE: Both transabdominal and transvaginal ultrasound examinations of the pelvis were performed. Transabdominal technique was performed for global imaging of the pelvis including uterus, ovaries, adnexal regions, and pelvic cul-de-sac. It was necessary to proceed with endovaginal exam following the transabdominal exam to visualize the right and left ovary. Color and duplex Doppler ultrasound was utilized to evaluate blood flow to the ovaries. COMPARISON:  Pelvic ultrasound 11/30/2020 FINDINGS: Uterus Measurements: 9.1  x 3.8 x 5 cm = volume: 90 mL. The uterus is anteverted. Myometrium is heterogeneous. No fibroids or other mass visualized. Endometrium Thickness: 4 mm. The endometrial myometrial junction is poorly defined. No focal abnormality visualized. Right ovary Measurements: 1.6 x 2 x 1.4 cm = volume: 2.4 mL. Normal appearance. Few physiologic follicles. Normal blood flow. No adnexal mass. Left ovary Measurements: 2 x 1.9 x 1.5 cm = volume: 2.8 mL. Normal appearance. Physiologic follicles. Normal blood flow. No adnexal mass. Pulsed Doppler evaluation of both ovaries demonstrates normal low-resistance arterial and venous waveforms. Other findings No abnormal free fluid. IMPRESSION: 1. Normal blood flow to both ovaries, no ovarian torsion. 2. Poorly defined myometrial endometrial junction is nonspecific but can be seen in the setting of adenomyosis. Electronically Signed   By: Andrea Gasman M.D.   On: 09/18/2023 17:04    All questions were answered. The patient knows to call the clinic with any problems, questions or concerns.    Amber Stalls, MD 09/30/2023 1:17 PM

## 2023-10-03 ENCOUNTER — Other Ambulatory Visit: Payer: Self-pay

## 2023-10-08 ENCOUNTER — Telehealth: Payer: Self-pay | Admitting: Family Medicine

## 2023-10-08 NOTE — Telephone Encounter (Signed)
 pt confirmed appt per volunteer

## 2023-10-09 ENCOUNTER — Encounter: Payer: Self-pay | Admitting: Family Medicine

## 2023-10-09 ENCOUNTER — Other Ambulatory Visit: Payer: Self-pay

## 2023-10-09 ENCOUNTER — Encounter: Payer: Self-pay | Admitting: Obstetrics and Gynecology

## 2023-10-09 ENCOUNTER — Ambulatory Visit: Attending: Family Medicine | Admitting: Family Medicine

## 2023-10-09 VITALS — BP 97/63 | HR 67 | Ht 62.0 in | Wt 120.6 lb

## 2023-10-09 DIAGNOSIS — F3160 Bipolar disorder, current episode mixed, unspecified: Secondary | ICD-10-CM

## 2023-10-09 DIAGNOSIS — N946 Dysmenorrhea, unspecified: Secondary | ICD-10-CM

## 2023-10-09 MED ORDER — DIVALPROEX SODIUM ER 500 MG PO TB24
500.0000 mg | ORAL_TABLET | Freq: Every day | ORAL | 1 refills | Status: DC
  Filled 2023-10-09: qty 30, 30d supply, fill #0
  Filled 2023-11-05: qty 30, 30d supply, fill #1

## 2023-10-09 NOTE — Patient Instructions (Signed)
 Norethindrone  Acetate Tablets (Hormone Replacement Therapy) What is this medication? NORETHINDRONE  ACETATE (nor eth IN drone AS e tate) treats irregular menstrual cycles. It may also be used to treat pain from endometriosis. It works by increasing levels of the hormone progesterone in your body. This medication is a progestin hormone. This medicine may be used for other purposes; ask your health care provider or pharmacist if you have questions. COMMON BRAND NAME(S): Aygestin , [place]  What should I tell my care team before I take this medication? They need to know if you have any of these conditions: Blood vessel disease or blood clots Breast, cervical, or vaginal cancer Depression Diabetes Heart disease Kidney disease Liver disease Migraine Seizures Stroke Vaginal bleeding An unusual or allergic reaction to norethindrone , other medications, foods, dyes, or preservatives Pregnant or trying to get pregnant Breast-feeding How should I use this medication? Take this medication by mouth with a glass of water. You may take this medication with or without food. Follow the directions on the prescription label. Take this medication at the same time each day. Do not take your medication more often than directed. A patient information sheet will be given with each prescription and refill. Read this sheet carefully each time. The sheet may change frequently. Talk to your care team about the use of this medication in children. Special care may be needed. Overdosage: If you think you have taken too much of this medicine contact a poison control center or emergency room at once. NOTE: This medicine is only for you. Do not share this medicine with others. What if I miss a dose? If you miss a dose, take it as soon as you can. If it is almost time for your next dose, take only that dose. Do not take double or extra doses. What may interact with this medication? Do not take this medication with any of  the following: Amprenavir or fosamprenavir Bosentan This medication may also interact with the following: Antibiotics or medications for infections, such as rifampin, rifabutin, rifapentine, and griseofulvin Aprepitant Barbiturate medications, such as phenobarbital Carbamazepine Felbamate Modafinil Oxcarbazepine Phenytoin Ritonavir or other medications for HIV infection or AIDS St. John's Wort Topiramate This list may not describe all possible interactions. Give your health care provider a list of all the medicines, herbs, non-prescription drugs, or dietary supplements you use. Also tell them if you smoke, drink alcohol, or use illegal drugs. Some items may interact with your medicine. What should I watch for while using this medication? Visit your care team for regular checks on your progress. You will need a regular breast and pelvic exam and Pap smear while on this medication. If you have any reason to think you are pregnant, stop taking this medication right away and contact your care team. If you are taking this medication for hormone-related problems, it may take several cycles of use to see improvement in your condition. What side effects may I notice from receiving this medication? Side effects that you should report to your care team as soon as possible: Allergic reactions--skin rash, itching, hives, swelling of the face, lips, tongue, or throat Blood clot--pain, swelling, or warmth in the leg, shortness of breath, chest pain Gallbladder problems--severe stomach pain, nausea, vomiting, fever Increase in blood pressure Liver injury--right upper belly pain, loss of appetite, nausea, light-colored stool, dark yellow or brown urine, yellowing skin or eyes, unusual weakness or fatigue New or worsening migraines or headaches Stroke--sudden numbness or weakness of the face, arm, or leg, trouble speaking, confusion,  trouble walking, loss of balance or coordination, dizziness, severe  headache, change in vision Unusual vaginal discharge, itching, or odor Worsening mood, feelings of depression Side effects that usually do not require medical attention (report to your care team if they continue or are bothersome): Breast pain or tenderness Dark patches of skin on the face or other sun-exposed areas Hair loss Irregular menstrual cycles or spotting Nausea Stomach pain This list may not describe all possible side effects. Call your doctor for medical advice about side effects. You may report side effects to FDA at 1-800-FDA-1088. Where should I keep my medication? Keep out of the reach of children and pets. Store at room temperature between 15 and 30 degrees C (59 and 86 degrees F). Throw away any unused medication after the expiration date. NOTE: This sheet is a summary. It may not cover all possible information. If you have questions about this medicine, talk to your doctor, pharmacist, or health care provider.  2024 Elsevier/Gold Standard (2021-01-04 00:00:00)

## 2023-10-09 NOTE — Progress Notes (Signed)
 Subjective:  Patient ID: Laura English, female    DOB: Jun 21, 1980  Age: 43 y.o. MRN: [id number]  CC: Medical Management of Chronic Issues (Discuss medications)     Discussed the use of AI scribe software for clinical note transcription with the patient, who gave verbal consent to proceed.  History of Present Illness Laura English is a 43 year old female with iron deficiency anemia, dysmenorrhea, Adenomyosis, Bipolar disorder who presents with medication management concerns.  She is experiencing issues with her Depakote  medication management, having been out of her ER 500 mg refill and using an ER250 mg dose twice daily for the past couple of weeks. Initially, I had prescribed ER500 mg in the morning and she returned to see the PA who added ER 250 mg in the evening due to subtherapeutic depakote  levels even though she felt her mood was stabilized on the 500mg .SABRA  Approximately three weeks ago, she visited the emergency room for severe menstrual pain and heavy bleeding. A pelvic ultrasound  revealed possible adenomyosis.  A gynecologist  prescribed Aygestin  for her dysmenorrhea , a hormone therapy, due to financial constraints and work commitments preventing hysterectomy. She has been on Aygestin  for about a week and is experiencing mood swings similar to those before starting Depakote .  Her menstrual periods have worsened over the past year, with increased flow, clotting, and severe pain, causing her to miss work for about two days each cycle. She is concerned about how her upcoming period will be affected by the new hormone therapy.  Her iron levels have improved significantly and she is not currently anemic. She has quit smoking cigarettes since her last visit and is attempting to gain weight, aiming to increase by five pounds. Her work in housekeeping involves physical labor, and her menstrual symptoms have impacted her ability to perform her job duties.    Past Medical  History:  Diagnosis Date   Depression    hx depression - no meds - no current problems   Heartburn in pregnancy    on pepcid   No pertinent past medical history     Past Surgical History:  Procedure Laterality Date   CESAREAN SECTION     3x   TUBAL LIGATION      Family History  Problem Relation Age of Onset   Emphysema Mother    Fibroids Mother    Alcohol abuse Father    Mental illness Father    Mental illness Brother    Fibroids Maternal Aunt    Diabetes Maternal Grandmother    Fibroids Maternal Grandmother    Dementia Maternal Grandfather     Social History   Socioeconomic History   Marital status: Single    Spouse name: Not on file   Number of children: Not on file   Years of education: Not on file   Highest education level: Not on file  Occupational History   Not on file  Tobacco Use   Smoking status: Every Day    Current packs/day: 1.00    Average packs/day: 1 pack/day for 10.0 years (10.0 ttl pk-yrs)    Types: E-cigarettes, Cigarettes   Smokeless tobacco: Never   Tobacco comments:    e- cig  Substance and Sexual Activity   Alcohol use: No   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   Sexual activity: Not Currently    Birth control/protection: None  Other Topics Concern   Not on  file  Social History Narrative   Not on file   Social Drivers of Health   Financial Resource Strain: Medium Risk (01/20/2023)   Overall Financial Resource Strain (CARDIA)    Difficulty of Paying Living Expenses: Somewhat hard  Food Insecurity: Food Insecurity Present (01/20/2023)   Hunger Vital Sign    Worried About Running Out of Food in the Last Year: Sometimes true    Ran Out of Food in the Last Year: Sometimes true  Transportation Needs: No Transportation Needs (01/20/2023)   PRAPARE - Administrator, Civil Service (Medical): No    Lack of Transportation (Non-Medical): No  Physical Activity: Inactive (01/20/2023)   Exercise  Vital Sign    Days of Exercise per Week: 0 days    Minutes of Exercise per Session: 0 min  Stress: Stress Concern Present (01/20/2023)   [employer] of Occupational Health - Occupational Stress Questionnaire    Feeling of Stress : To some extent  Social Connections: Socially Isolated (01/20/2023)   Social Connection and Isolation Panel    Frequency of Communication with Friends and Family: Twice a week    Frequency of Social Gatherings with Friends and Family: Three times a week    Attends Religious Services: Never    Active Member of Clubs or Organizations: No    Attends Banker Meetings: Never    Marital Status: Divorced    No Known Allergies  Outpatient Medications Prior to Visit  Medication Sig Dispense Refill   hydrOXYzine  (VISTARIL ) 25 MG capsule Take 25 mg by mouth 3 (three) times daily as needed.     norethindrone  (AYGESTIN ) 5 MG tablet Take 1 tablet (5 mg total) by mouth daily. 90 tablet 3   divalproex  (DEPAKOTE  ER) 250 MG 24 hr tablet Take 1 tablet (250 mg total) by mouth at bedtime. 30 tablet 3   divalproex  (DEPAKOTE  ER) 500 MG 24 hr tablet Take 1 tablet (500 mg total) by mouth daily. 30 tablet 1   fluconazole  (DIFLUCAN ) 150 MG tablet Take 1 tablet (150 mg total) by mouth daily. Take 1 tablet now and repeat in 3 days (Patient not taking: Reported on 10/09/2023) 2 tablet 0   HYDROcodone -acetaminophen  (NORCO/VICODIN) 5-325 MG tablet Take 1 tablet by mouth every 6 (six) hours as needed. (Patient not taking: Reported on 10/09/2023) 10 tablet 0   Phenazopyridine  HCl (URINARY PAIN RELIEF PO) Take by mouth. (Patient not taking: Reported on 10/09/2023)     No facility-administered medications prior to visit.     ROS Review of Systems  Constitutional:  Negative for activity change and appetite change.  HENT:  Negative for sinus pressure and sore throat.   Respiratory:  Negative for chest tightness, shortness of breath and wheezing.   Cardiovascular:  Negative  for chest pain and palpitations.  Gastrointestinal:  Negative for abdominal distention, abdominal pain and constipation.  Genitourinary: Negative.   Musculoskeletal: Negative.   Psychiatric/Behavioral:  Negative for behavioral problems and dysphoric mood.     Objective:  BP 97/63   Pulse 67   Ht 5' 2 (1.575 m)   Wt 120 lb 9.6 oz (54.7 kg)   LMP 09/16/2023 (Exact Date)   SpO2 99%   BMI 22.06 kg/m      10/09/2023    8:40 AM 09/30/2023    1:16 PM 09/26/2023   10:30 AM  BP/Weight  Systolic BP 97 99 108  Diastolic BP 63 60 68  Wt. (Lbs) 120.6 123.6   BMI 22.06 kg/m2 22.61  kg/m2       Physical Exam Constitutional:      Appearance: She is well-developed.  Cardiovascular:     Rate and Rhythm: Normal rate.     Heart sounds: Normal heart sounds. No murmur heard. Pulmonary:     Effort: Pulmonary effort is normal.     Breath sounds: Normal breath sounds. No wheezing or rales.  Chest:     Chest wall: No tenderness.  Abdominal:     General: Bowel sounds are normal. There is no distension.     Palpations: Abdomen is soft. There is no mass.     Tenderness: There is no abdominal tenderness.  Musculoskeletal:        General: Normal range of motion.     Right lower leg: No edema.     Left lower leg: No edema.  Neurological:     Mental Status: She is alert and oriented to person, place, and time.  Psychiatric:        Mood and Affect: Mood normal.        Latest Ref Rng & Units 09/18/2023   10:07 AM 01/22/2023    8:48 AM 11/30/2020   10:19 PM  CMP  Glucose 70 - 99 mg/dL 99  85  895   BUN 6 - 20 mg/dL 8  9  13    Creatinine 0.44 - 1.00 mg/dL 9.31  9.36  9.35   Sodium 135 - 145 mmol/L 138  142  135   Potassium 3.5 - 5.1 mmol/L 3.9  4.9  3.6   Chloride 98 - 111 mmol/L 105  108  104   CO2 22 - 32 mmol/L 23  20  23    Calcium 8.9 - 10.3 mg/dL 9.0  9.0  9.0   Total Protein 6.5 - 8.1 g/dL 7.2  6.4  7.8   Total Bilirubin 0.0 - 1.2 mg/dL 0.5  <9.7  0.5   Alkaline Phos 38 - 126 U/L  58  76  67   AST 15 - 41 U/L 21  13  17    ALT 0 - 44 U/L 12  8  9      Lipid Panel     Component Value Date/Time   CHOL 153 01/22/2023 0848   TRIG 176 (H) 01/22/2023 0848   HDL 43 01/22/2023 0848   CHOLHDL 3.7 01/08/2018 0943   CHOLHDL 3.7 12/07/2012 1221   VLDL 19 12/07/2012 1221   LDLCALC 80 01/22/2023 0848    CBC    Component Value Date/Time   WBC 10.9 (H) 09/30/2023 1247   RBC 4.04 09/30/2023 1247   HGB 13.4 09/30/2023 1247   HGB 12.0 03/26/2023 1506   HCT 39.5 09/30/2023 1247   HCT 36.5 03/26/2023 1506   PLT 309 09/30/2023 1247   PLT 416 03/26/2023 1506   MCV 97.8 09/30/2023 1247   MCV 97 03/26/2023 1506   MCH 33.2 09/30/2023 1247   MCHC 33.9 09/30/2023 1247   RDW 12.1 09/30/2023 1247   RDW 12.8 03/26/2023 1506   LYMPHSABS 3.5 09/30/2023 1247   LYMPHSABS 3.7 (H) 03/26/2023 1506   MONOABS 0.5 09/30/2023 1247   EOSABS 0.3 09/30/2023 1247   EOSABS 0.5 (H) 03/26/2023 1506   BASOSABS 0.1 09/30/2023 1247   BASOSABS 0.1 03/26/2023 1506    Lab Results  Component Value Date   HGBA1C 5.3 01/22/2023       Assessment & Plan Bipolar disorder with mood instability on Depakote  Mood instability possibly due to Aygestin  initiation and suboptimal Depakote   dosing. Depakote  effective at previous dose. - Refill Depakote  at 500 mg ER daily. Advised Depakote  ER is a daily not bid medication and so I have discontinued 250mg . If symptoms are suboptimally controlled, will add on ER   250mg  to take along with ER 500mg  in the am. - Continue Aygestin  and monitor mood stability for two weeks.   Adenomyosis with dysmenorrhea Severe dysmenorrhea and menorrhagia managed with Aygestin . Hysterectomy considered if symptoms unmanageable, but currently not feasible due to financial and work constraints. - Monitor menstrual symptoms during upcoming cycle on Aygestin . - Contact GYN via MyChart for work absence documentation due to dysmenorrhea. - Contact GYN if mood instability persists  despite optimal Depakote  dosing. - Discussed potential for future hysterectomy if symptoms remain unmanageable.      Meds ordered this encounter  Medications   divalproex  (DEPAKOTE  ER) 500 MG 24 hr tablet    Sig: Take 1 tablet (500 mg total) by mouth daily.    Dispense:  90 tablet    Refill:  1    Discontinue 250 mg ER    Follow-up: Return in about 6 months (around 04/10/2024) for Chronic medical conditions.       Corrina Sabin, MD, FAAFP. [facility] [place], [place] [phone]   10/09/2023, 9:28 AM

## 2023-10-31 ENCOUNTER — Other Ambulatory Visit: Payer: Self-pay

## 2023-11-04 ENCOUNTER — Ambulatory Visit: Admitting: Family Medicine

## 2023-11-05 ENCOUNTER — Other Ambulatory Visit: Payer: Self-pay

## 2023-11-05 ENCOUNTER — Other Ambulatory Visit (HOSPITAL_COMMUNITY)
Admission: RE | Admit: 2023-11-05 | Discharge: 2023-11-05 | Disposition: A | Discharge status: Home / Self Care | Source: Ambulatory Visit | Attending: Family Medicine | Admitting: Family Medicine

## 2023-11-05 ENCOUNTER — Encounter: Payer: Self-pay | Admitting: Family Medicine

## 2023-11-05 ENCOUNTER — Ambulatory Visit: Attending: Family Medicine | Admitting: Family Medicine

## 2023-11-05 VITALS — BP 110/69 | HR 69 | Ht 62.0 in | Wt 124.0 lb

## 2023-11-05 DIAGNOSIS — N898 Other specified noninflammatory disorders of vagina: Secondary | ICD-10-CM | POA: Insufficient documentation

## 2023-11-05 DIAGNOSIS — F3160 Bipolar disorder, current episode mixed, unspecified: Secondary | ICD-10-CM | POA: Diagnosis not present

## 2023-11-05 DIAGNOSIS — M62838 Other muscle spasm: Secondary | ICD-10-CM

## 2023-11-05 LAB — POCT URINALYSIS DIP (CLINITEK)
Bilirubin, UA: NEGATIVE
Blood, UA: NEGATIVE
Glucose, UA: NEGATIVE mg/dL
Ketones, POC UA: NEGATIVE mg/dL
Leukocytes, UA: NEGATIVE
Nitrite, UA: NEGATIVE
POC PROTEIN,UA: NEGATIVE
Spec Grav, UA: 1.01 (ref 1.010–1.025)
Urobilinogen, UA: 0.2 U/dL
pH, UA: 6 (ref 5.0–8.0)

## 2023-11-05 MED ORDER — TIZANIDINE HCL 4 MG PO TABS
4.0000 mg | ORAL_TABLET | Freq: Every evening | ORAL | 1 refills | Status: DC | PRN
  Filled 2023-11-05: qty 30, 30d supply, fill #0

## 2023-11-05 NOTE — Patient Instructions (Signed)
 Muscle Cramps and Spasms Muscle cramps and spasms occur when a muscle or muscles tighten and you have no control over this tightening (involuntary muscle contraction). They are a common problem that can happen in any muscle. The most common place is in the calf muscles of the leg. There are a few ways that muscle cramps and spasms differ: Muscle cramps are painful. They come and go and may last for a few seconds or up to 15 minutes. Muscle cramps are often more forceful and last longer than muscle spasms. Muscle spasms may or may not be painful. They may last just a few seconds or last much longer. Certain conditions, such as diabetes or Parkinson's disease, can make you more likely to have cramps or spasms. But in most cases, cramps and spasms are not caused by other conditions. Common causes include: Overexertion. This is when you do more physical work or exercise than your body is ready for. Overuse from doing the same movements too many times. Staying in one position for too long. Improper preparation, form, or technique when playing a sport or doing an activity. Not enough water or other fluids in your body (dehydration). Other causes may include: Injury. Side effects of some medicines. Too few salts and minerals in your body (electrolytes), such as potassium and calcium . This could happen if you are taking water pills (diuretics) or if you are pregnant. In many cases, the cause of muscle cramps or spasms is not known. Follow these instructions at home: Eating and drinking Drink enough fluid to keep your pee (urine) pale yellow. This can help prevent cramps or spasms. Eat a healthy diet that includes a lot of nutrients to help your muscles work. A healthy diet includes fruits and vegetables, lean protein, whole grains, and low-fat or nonfat dairy products. Managing pain and stiffness     Try to massage, stretch, and relax the affected muscle. Do this for a few minutes at a time. If told,  put ice on the muscles. This may help if you are sore or have pain after a cramp or spasm. Put ice in a plastic bag. Place a towel between your skin and the bag. Leave the ice on for 20 minutes, 2-3 times a day. If told, apply heat to tight or tense muscles as often as told by your health care provider. Use the heat source that your provider recommends, such as a moist heat pack or a heating pad. Place a towel between your skin and the heat source. Leave the heat on for 20-30 minutes. If your skin turns bright red, remove the ice or heat right away to prevent skin damage. The risk of damage is higher if you cannot feel pain, heat, or cold. Take hot showers or baths to help relax tight muscles. General instructions If you are having cramps often, avoid intense exercise for a few days. Take over-the-counter and prescription medicines only as told by your provider. Watch for any changes in your symptoms. Contact a health care provider if: Your cramps or spasms get more severe or happen more often. Your cramps or spasms do not get better over time. This information is not intended to replace advice given to you by your health care provider. Make sure you discuss any questions you have with your health care provider. Document Revised: 10/02/2021 Document Reviewed: 10/02/2021 Elsevier Patient Education  2024 [employer].

## 2023-11-05 NOTE — Progress Notes (Signed)
 Subjective:  Patient ID: Laura English, female    DOB: 01-Oct-1980  Age: 43 y.o. MRN: [id number]  CC: Vaginal Discharge and Abdominal Pain     Discussed the use of AI scribe software for clinical note transcription with the patient, who gave verbal consent to proceed.  History of Present Illness Laura English is a 43 year old female  with iron deficiency anemia, dysmenorrhea, Adenomyosis, Bipolar disorder who presents with bilateral flank pain and concerns of possible bacterial vaginosis or sexually transmitted infection.  She experiences bilateral flank pain that began yesterday and has worsened today, making ambulation painful. The pain is located on the sides of her back and is not central with no radiation down her legs. 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 Ibuprofen  provides relief if she remains inactive.  She has had vaginal discharge with an odor for a couple of weeks. She denies dysuria and urinary frequency.  She has been on Depakote  500mg  ER daily for mood stabilization for about a month, with a recent discontinuation of the 250ER which she was taking in the evening prescribed by the PA as a bid dosing. Her symptoms are controlled, though she experiences irritability sometimes which she thinks could be attributed to the hormone pills she is taking    She notes soreness in her leg muscle, which she noticed after her daughter had COVID-19 last week. She tests daily for COVID-19 due to work requirements and has not reported any positive results.    Past Medical History:  Diagnosis Date   Depression    hx depression - no meds - no current problems   Heartburn in pregnancy    on pepcid   No pertinent past medical history     Past Surgical History:  Procedure Laterality Date   CESAREAN SECTION     3x   TUBAL LIGATION      Family History  Problem Relation Age of Onset   Emphysema Mother    Fibroids Mother    Alcohol  abuse Father    Mental illness Father    Mental illness Brother    Fibroids Maternal Aunt    Diabetes Maternal Grandmother    Fibroids Maternal Grandmother    Dementia Maternal Grandfather     Social History   Socioeconomic History   Marital status: Single    Spouse name: Not on file   Number of children: Not on file   Years of education: Not on file   Highest education level: Not on file  Occupational History   Not on file  Tobacco Use   Smoking status: Every Day    Current packs/day: 1.00    Average packs/day: 1 pack/day for 10.0 years (10.0 ttl pk-yrs)    Types: E-cigarettes, Cigarettes   Smokeless tobacco: Never   Tobacco comments:    e- cig  Substance and Sexual Activity   Alcohol use: No   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   Sexual activity: Not Currently    Birth control/protection: None  Other Topics Concern   Not on file  Social History Narrative   Not on file   Social Drivers of Health   Financial Resource Strain: Medium Risk (01/20/2023)   Overall Financial Resource Strain (CARDIA)    Difficulty of Paying Living Expenses: Somewhat hard  Food Insecurity: Food Insecurity Present (01/20/2023)   Hunger Vital Sign    Worried About  Running Out of Food in the Last Year: Sometimes true    Ran Out of Food in the Last Year: Sometimes true  Transportation Needs: No Transportation Needs (01/20/2023)   PRAPARE - Administrator, Civil Service (Medical): No    Lack of Transportation (Non-Medical): No  Physical Activity: Inactive (01/20/2023)   Exercise Vital Sign    Days of Exercise per Week: 0 days    Minutes of Exercise per Session: 0 min  Stress: Stress Concern Present (01/20/2023)   [employer] of Occupational Health - Occupational Stress Questionnaire    Feeling of Stress : To some extent  Social Connections: Socially Isolated (01/20/2023)   Social Connection and Isolation Panel    Frequency of  Communication with Friends and Family: Twice a week    Frequency of Social Gatherings with Friends and Family: Three times a week    Attends Religious Services: Never    Active Member of Clubs or Organizations: No    Attends Banker Meetings: Never    Marital Status: Divorced    No Known Allergies  Outpatient Medications Prior to Visit  Medication Sig Dispense Refill   divalproex  (DEPAKOTE  ER) 500 MG 24 hr tablet Take 1 tablet (500 mg total) by mouth daily. 90 tablet 1   fluconazole  (DIFLUCAN ) 150 MG tablet Take 1 tablet (150 mg total) by mouth daily. Take 1 tablet now and repeat in 3 days (Patient not taking: Reported on 10/09/2023) 2 tablet 0   HYDROcodone -acetaminophen  (NORCO/VICODIN) 5-325 MG tablet Take 1 tablet by mouth every 6 (six) hours as needed. (Patient not taking: Reported on 10/09/2023) 10 tablet 0   hydrOXYzine  (VISTARIL ) 25 MG capsule Take 25 mg by mouth 3 (three) times daily as needed.     norethindrone  (AYGESTIN ) 5 MG tablet Take 1 tablet (5 mg total) by mouth daily. 90 tablet 3   Phenazopyridine  HCl (URINARY PAIN RELIEF PO) Take by mouth. (Patient not taking: Reported on 10/09/2023)     No facility-administered medications prior to visit.     ROS Review of Systems  Constitutional:  Negative for activity change and appetite change.  HENT:  Negative for sinus pressure and sore throat.   Respiratory:  Negative for chest tightness, shortness of breath and wheezing.   Cardiovascular:  Negative for chest pain and palpitations.  Gastrointestinal:  Negative for abdominal distention, abdominal pain and constipation.  Genitourinary:  Positive for vaginal discharge.  Musculoskeletal:  Positive for back pain.  Psychiatric/Behavioral:  Negative for behavioral problems and dysphoric mood.     Objective:  BP 110/69   Pulse 69   Ht 5' 2 (1.575 m)   Wt 124 lb (56.2 kg)   SpO2 100%   BMI 22.68 kg/m      11/05/2023    4:02 PM 10/09/2023    8:40 AM 09/30/2023     1:16 PM  BP/Weight  Systolic BP 110 97 99  Diastolic BP 69 63 60  Wt. (Lbs) 124 120.6 123.6  BMI 22.68 kg/m2 22.06 kg/m2 22.61 kg/m2      Physical Exam Constitutional:      Appearance: She is well-developed.  Cardiovascular:     Rate and Rhythm: Normal rate.     Heart sounds: Normal heart sounds. No murmur heard. Pulmonary:     Effort: Pulmonary effort is normal.     Breath sounds: Normal breath sounds. No wheezing or rales.  Chest:     Chest wall: No tenderness.  Abdominal:  General: Bowel sounds are normal. There is no distension.     Palpations: Abdomen is soft. There is no mass.     Tenderness: There is no abdominal tenderness.  Musculoskeletal:     Right lower leg: No edema.     Left lower leg: No edema.     Comments: TTP of bilateral thoracolumbar muscles of spine Negative straight leg raise  Neurological:     Mental Status: She is alert and oriented to person, place, and time.  Psychiatric:        Mood and Affect: Mood normal.        Latest Ref Rng & Units 09/18/2023   10:07 AM 01/22/2023    8:48 AM 11/30/2020   10:19 PM  CMP  Glucose 70 - 99 mg/dL 99  85  895   BUN 6 - 20 mg/dL 8  9  13    Creatinine 0.44 - 1.00 mg/dL 9.31  9.36  9.35   Sodium 135 - 145 mmol/L 138  142  135   Potassium 3.5 - 5.1 mmol/L 3.9  4.9  3.6   Chloride 98 - 111 mmol/L 105  108  104   CO2 22 - 32 mmol/L 23  20  23    Calcium 8.9 - 10.3 mg/dL 9.0  9.0  9.0   Total Protein 6.5 - 8.1 g/dL 7.2  6.4  7.8   Total Bilirubin 0.0 - 1.2 mg/dL 0.5  <9.7  0.5   Alkaline Phos 38 - 126 U/L 58  76  67   AST 15 - 41 U/L 21  13  17    ALT 0 - 44 U/L 12  8  9      Lipid Panel     Component Value Date/Time   CHOL 153 01/22/2023 0848   TRIG 176 (H) 01/22/2023 0848   HDL 43 01/22/2023 0848   CHOLHDL 3.7 01/08/2018 0943   CHOLHDL 3.7 12/07/2012 1221   VLDL 19 12/07/2012 1221   LDLCALC 80 01/22/2023 0848    CBC    Component Value Date/Time   WBC 10.9 (H) 09/30/2023 1247   RBC 4.04  09/30/2023 1247   HGB 13.4 09/30/2023 1247   HGB 12.0 03/26/2023 1506   HCT 39.5 09/30/2023 1247   HCT 36.5 03/26/2023 1506   PLT 309 09/30/2023 1247   PLT 416 03/26/2023 1506   MCV 97.8 09/30/2023 1247   MCV 97 03/26/2023 1506   MCH 33.2 09/30/2023 1247   MCHC 33.9 09/30/2023 1247   RDW 12.1 09/30/2023 1247   RDW 12.8 03/26/2023 1506   LYMPHSABS 3.5 09/30/2023 1247   LYMPHSABS 3.7 (H) 03/26/2023 1506   MONOABS 0.5 09/30/2023 1247   EOSABS 0.3 09/30/2023 1247   EOSABS 0.5 (H) 03/26/2023 1506   BASOSABS 0.1 09/30/2023 1247   BASOSABS 0.1 03/26/2023 1506    Lab Results  Component Value Date   HGBA1C 5.3 01/22/2023       Assessment & Plan Muscle spasm Acute bilateral low back pain, likely musculoskeletal, possibly due to strain. No radiating pain, numbness, or tingling. Urinalysis negative for kidney stones or infection. - Prescribed muscle relaxant for home use, caution for drowsiness - Tizanidine  - Advised use of heating pad for pain relief. - Recommended back exercises for flexibility and strength post muscle relaxant.   Vaginal discharge, etiology pending Vaginal discharge with odor, differential includes bacterial vaginosis, yeast infection, or STI. No urinary symptoms. - UA negative for UTI - Await vaginal swab results to determine etiology. - Plan  treatment based on swab results.  Bipolar disorder Bipolar disorder well-managed on reduced Divalproex  dose. Improved mood stability and reduced irritability. No significant mood swings. - Continue Divalproex  500 mg daily and 250 mg at bedtime. - Monitor mood changes and adjust treatment as necessary.     Meds ordered this encounter  Medications   tiZANidine  (ZANAFLEX ) 4 MG tablet    Sig: Take 1 tablet (4 mg total) by mouth at bedtime as needed.    Dispense:  60 tablet    Refill:  1    Follow-up: Return for previously scheduled appointment.       Corrina Sabin, MD, FAAFP. [facility] [place], [place] [phone]   11/05/2023, 4:27 PM

## 2023-11-06 ENCOUNTER — Other Ambulatory Visit: Payer: Self-pay

## 2023-11-06 ENCOUNTER — Ambulatory Visit: Payer: Self-pay | Admitting: Family Medicine

## 2023-11-06 LAB — CERVICOVAGINAL ANCILLARY ONLY
Bacterial Vaginitis (gardnerella): POSITIVE — AB
Candida Glabrata: NEGATIVE
Candida Vaginitis: NEGATIVE
Chlamydia: NEGATIVE
Comment: NEGATIVE
Comment: NEGATIVE
Comment: NEGATIVE
Comment: NEGATIVE
Comment: NEGATIVE
Comment: NORMAL
Neisseria Gonorrhea: NEGATIVE
Trichomonas: NEGATIVE

## 2023-11-06 MED ORDER — METRONIDAZOLE 500 MG PO TABS
500.0000 mg | ORAL_TABLET | Freq: Two times a day (BID) | ORAL | 0 refills | Status: AC
  Filled 2023-11-06: qty 14, 7d supply, fill #0

## 2023-11-18 ENCOUNTER — Ambulatory Visit: Admitting: Family Medicine

## 2023-11-28 ENCOUNTER — Other Ambulatory Visit: Payer: Self-pay

## 2023-12-12 ENCOUNTER — Other Ambulatory Visit: Payer: Self-pay | Admitting: Family Medicine

## 2023-12-12 ENCOUNTER — Encounter: Payer: Self-pay | Admitting: Family Medicine

## 2023-12-12 MED ORDER — DIVALPROEX SODIUM ER 500 MG PO TB24
500.0000 mg | ORAL_TABLET | Freq: Every day | ORAL | 1 refills | Status: AC
  Filled 2023-12-12: qty 30, 30d supply, fill #0
  Filled 2023-12-15: qty 60, 60d supply, fill #1
  Filled 2024-03-10: qty 60, 60d supply, fill #2

## 2023-12-12 MED ORDER — TIZANIDINE HCL 4 MG PO TABS
4.0000 mg | ORAL_TABLET | Freq: Every evening | ORAL | 1 refills | Status: AC | PRN
  Filled 2023-12-12: qty 30, 30d supply, fill #0

## 2023-12-15 ENCOUNTER — Other Ambulatory Visit: Payer: Self-pay

## 2023-12-15 ENCOUNTER — Encounter: Payer: Self-pay | Admitting: Obstetrics and Gynecology

## 2023-12-16 ENCOUNTER — Other Ambulatory Visit: Payer: Self-pay

## 2023-12-18 ENCOUNTER — Other Ambulatory Visit: Payer: Self-pay | Admitting: Obstetrics and Gynecology

## 2023-12-18 ENCOUNTER — Other Ambulatory Visit: Payer: Self-pay

## 2023-12-18 DIAGNOSIS — N946 Dysmenorrhea, unspecified: Secondary | ICD-10-CM

## 2023-12-18 MED ORDER — NORETHINDRONE ACETATE 5 MG PO TABS
5.0000 mg | ORAL_TABLET | Freq: Every day | ORAL | 0 refills | Status: AC
  Filled 2023-12-18 – 2023-12-19 (×3): qty 90, 90d supply, fill #0
  Filled 2023-12-22: qty 60, 60d supply, fill #1
  Filled 2023-12-22: qty 30, 30d supply, fill #0
  Filled 2024-03-10: qty 30, 30d supply, fill #1

## 2023-12-18 NOTE — Telephone Encounter (Signed)
 Med refill request:Aygestin   Last AEX: last OV 09/26/23 Next AEX: not scheduled  Last MMG (if hormonal med) Refill authorized: Pharmacy comment: Pt is going out of town and is requesting a new rx for a 90 day supply Please Advise?

## 2023-12-19 ENCOUNTER — Other Ambulatory Visit: Payer: Self-pay

## 2023-12-19 ENCOUNTER — Telehealth: Payer: Self-pay

## 2023-12-19 NOTE — Telephone Encounter (Signed)
 Patient left message in refill line stating that the pharmacy told her that they did not have her 90 day supply of Aygestin . I called and spoke with pharmacy they let me know they have it, its just too early to fill. Pharmacy states that it would cost $64.79 to be filled out of pocket. Called and spoke with patient to give her this information, patient agrees and understands. Mychart message also sent to patient reiterating phone call due to phone hanging up mid call.

## 2023-12-22 ENCOUNTER — Other Ambulatory Visit: Payer: Self-pay

## 2023-12-25 ENCOUNTER — Ambulatory Visit: Admitting: Obstetrics and Gynecology

## 2023-12-31 ENCOUNTER — Other Ambulatory Visit: Payer: Self-pay

## 2024-03-11 ENCOUNTER — Other Ambulatory Visit: Payer: Self-pay

## 2024-03-11 ENCOUNTER — Other Ambulatory Visit (HOSPITAL_COMMUNITY): Payer: Self-pay

## 2024-03-15 ENCOUNTER — Other Ambulatory Visit: Payer: Self-pay

## 2024-03-16 ENCOUNTER — Other Ambulatory Visit: Payer: Self-pay

## 2024-04-15 ENCOUNTER — Ambulatory Visit: Admitting: Family Medicine
